# Patient Record
Sex: Female | Born: 1970 | Race: White | Hispanic: No | Marital: Single | Smoking: Never smoker
Health system: Southern US, Community
[De-identification: ages and names within clinical notes are randomized; demographics above are authoritative.]

## PROBLEM LIST (undated history)

## (undated) DIAGNOSIS — T7840XA Allergy, unspecified, initial encounter: Secondary | ICD-10-CM

## (undated) DIAGNOSIS — J45909 Unspecified asthma, uncomplicated: Secondary | ICD-10-CM

## (undated) HISTORY — DX: Allergy, unspecified, initial encounter: T78.40XA

## (undated) HISTORY — PX: NO PAST SURGERIES: SHX2092

## (undated) HISTORY — DX: Unspecified asthma, uncomplicated: J45.909

---

## 1999-09-24 ENCOUNTER — Other Ambulatory Visit: Admission: RE | Admit: 1999-09-24 | Discharge: 1999-09-24 | Payer: Self-pay | Admitting: *Deleted

## 2006-02-05 ENCOUNTER — Ambulatory Visit (HOSPITAL_COMMUNITY): Admission: RE | Admit: 2006-02-05 | Discharge: 2006-02-05 | Payer: Self-pay | Admitting: Family Medicine

## 2006-03-31 ENCOUNTER — Ambulatory Visit (HOSPITAL_COMMUNITY): Admission: RE | Admit: 2006-03-31 | Discharge: 2006-03-31 | Payer: Self-pay | Admitting: Obstetrics & Gynecology

## 2006-08-03 ENCOUNTER — Inpatient Hospital Stay (HOSPITAL_COMMUNITY): Admission: AD | Admit: 2006-08-03 | Discharge: 2006-08-05 | Payer: Self-pay | Admitting: Obstetrics and Gynecology

## 2007-10-19 IMAGING — US US OB COMP LESS 14 WK
1 series · 14 of 28 positions shown · non-contrast
Comparison: none

CLINICAL DATA: Size/date discrepancy.  11 week 4 day gestational age by LMP.  Evaluate dating.
OBSTETRICAL ULTRASOUND <14 WKS:

[Series 1: us ob comp less 14 wk · 0.31mm/px · 14 of 46 slices shown]
[im 2/46]
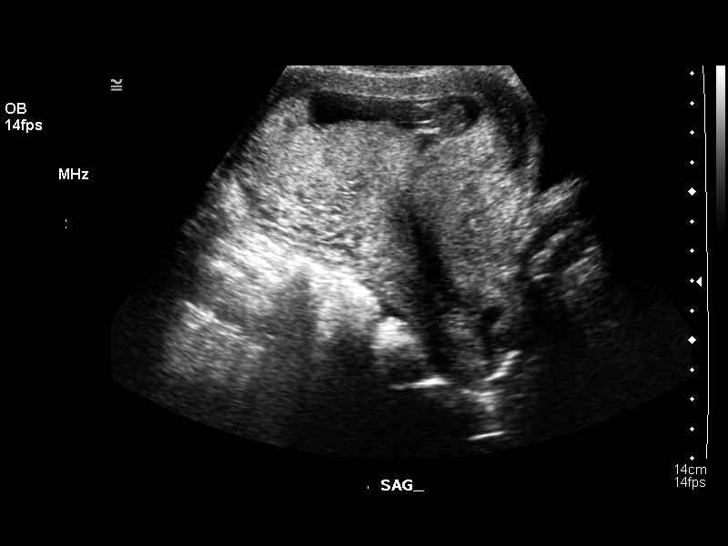
[im 6/46]
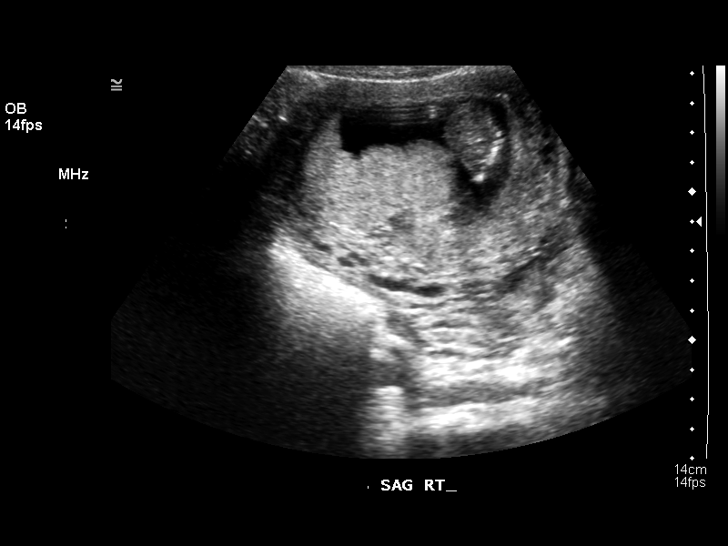
[im 9/46]
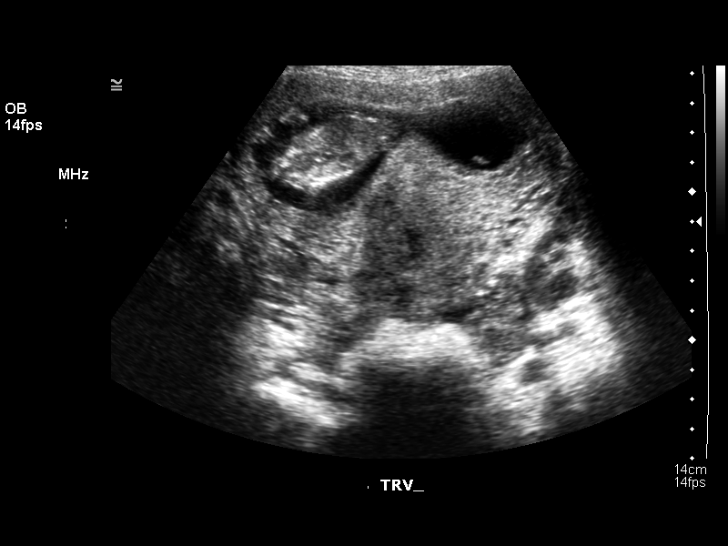
[im 12/46]
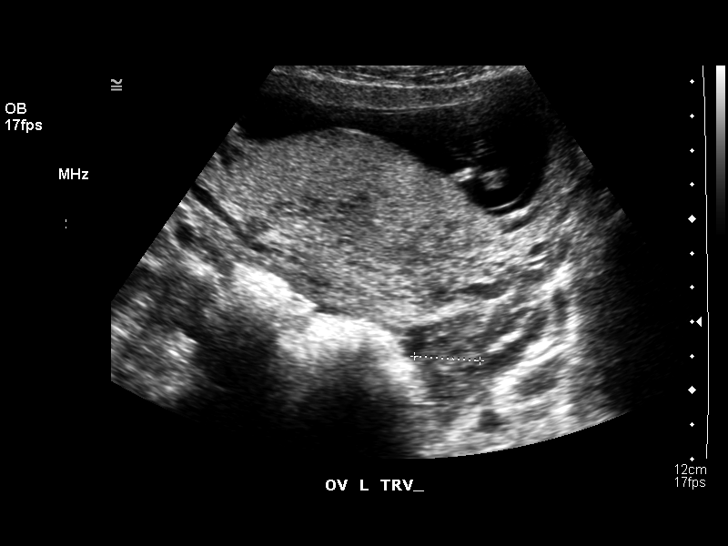
[im 16/46]
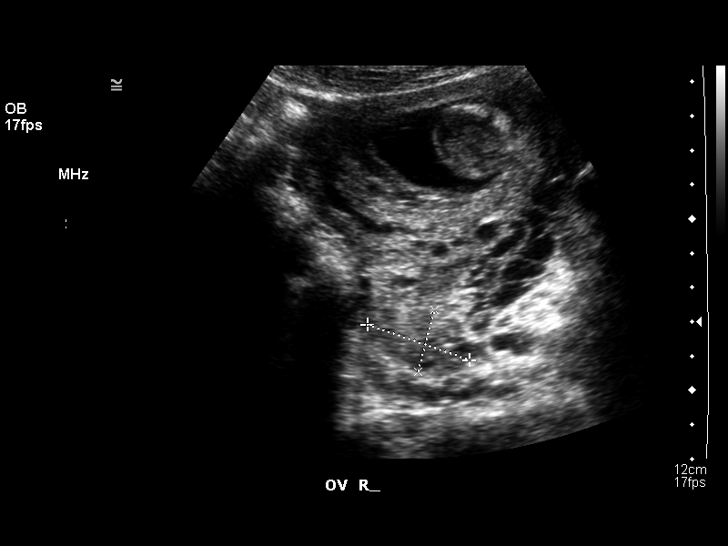
[im 19/46]
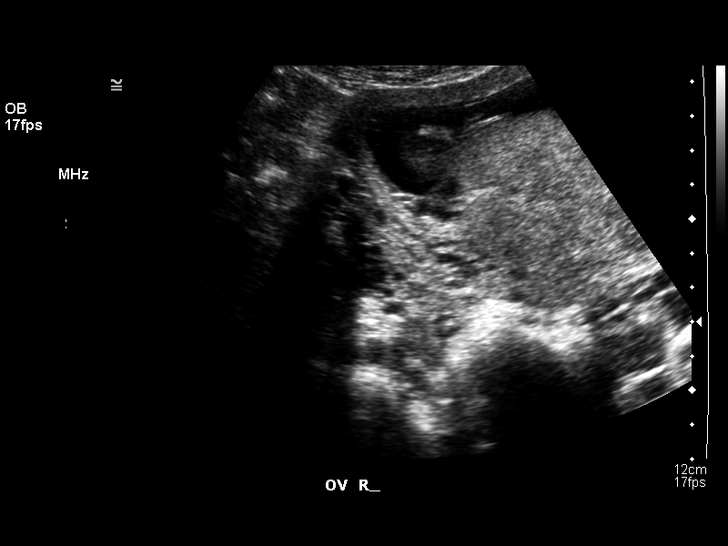
[im 22/46]
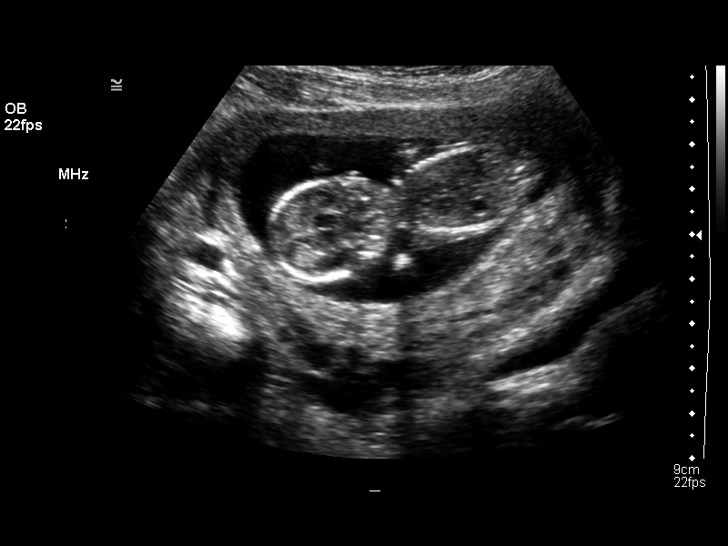
[im 26/46]
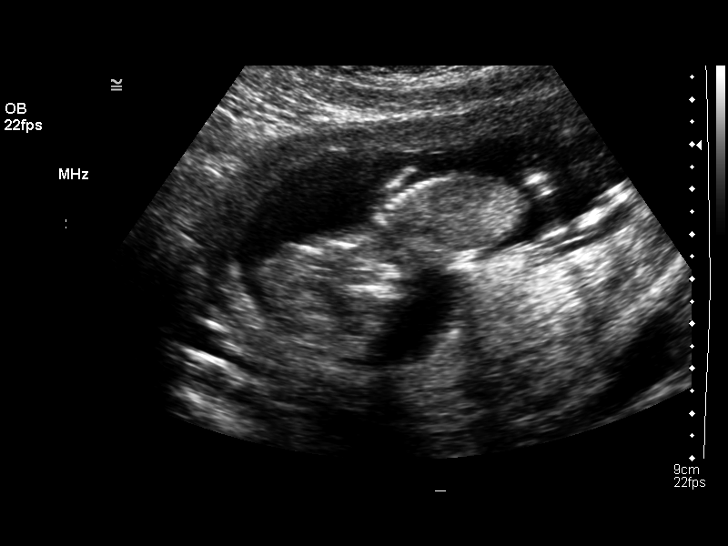
[im 29/46]
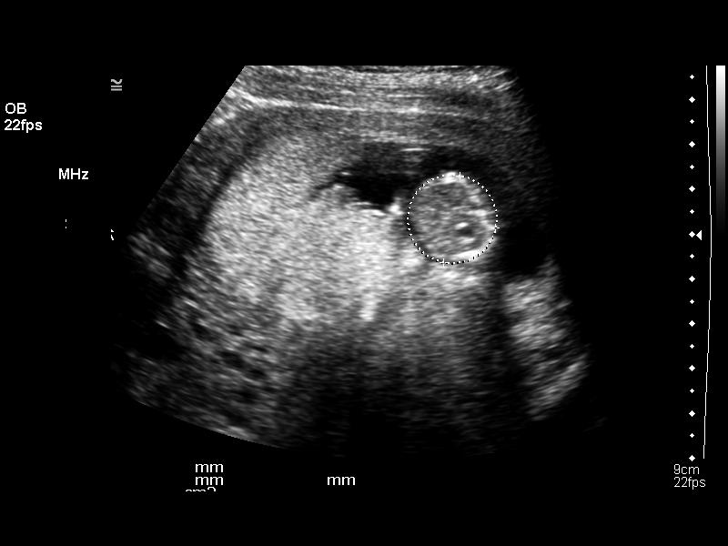
[im 32/46]
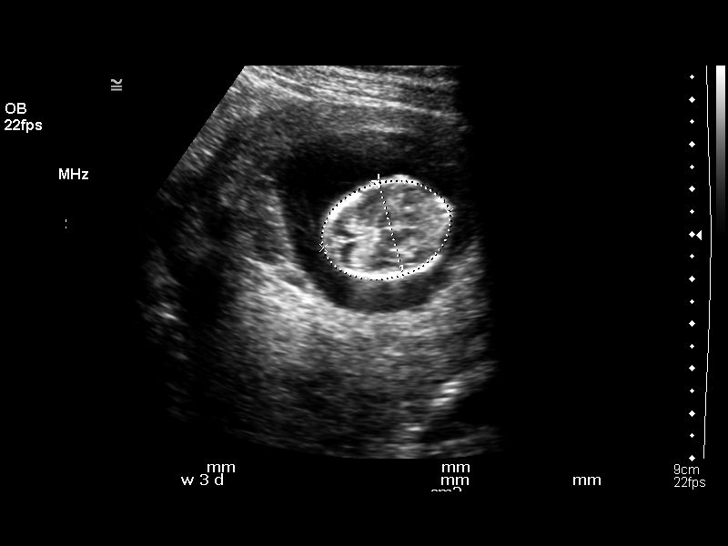
[im 36/46]
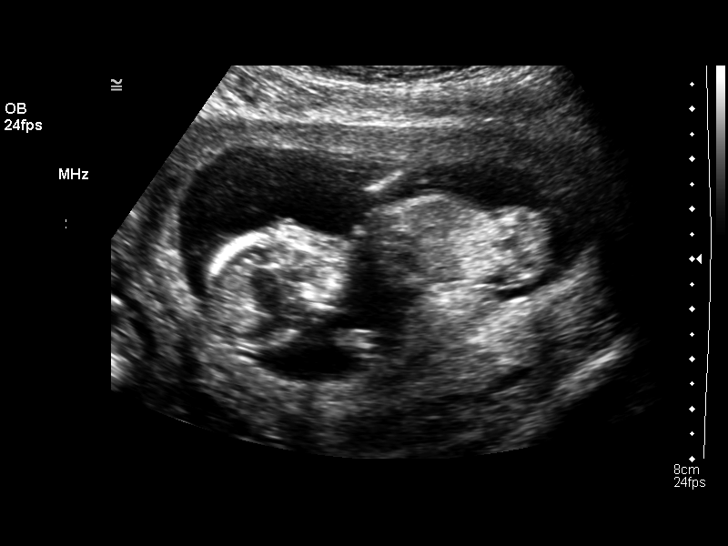
[im 39/46]
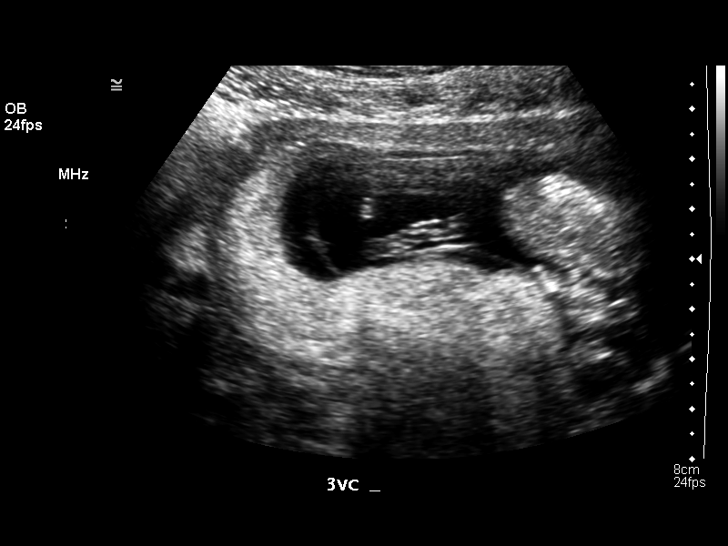
[im 42/46]
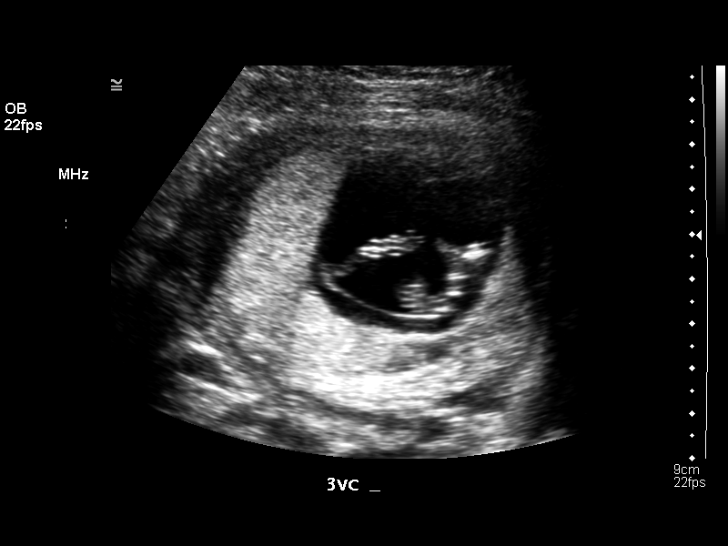
[im 46/46]
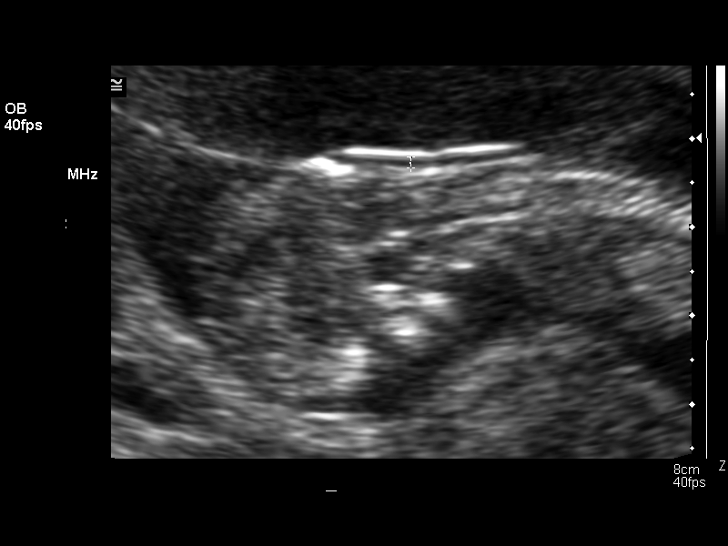

[14 of 28 positions shown; findings below may reference images not displayed]

Number of Fetuses:  1
Heart Rate:  153 bpm
Movement:  Yes  
Presentation:  Variable      
Amniotic Fluid (Subjective):  Normal

FETAL BIOMETRY
CRL:  7.0 cm    13 w  1 d
BPD:  2.1 cm    13 w  3 d 
HC:  7.8 cm    13 w  3 d
AC:  6.1 cm    12 w  6 d
FL:  0.9 cm    12 w  5 d

MEAN GA:  13 w  1 d   US EDC:  08/12/06

Fetal anatomy could not be evaluated due to the early gestational age.  
The following fetal anatomy was visualized:  Choroid plexus, stomach, 3-vessel cord, cord insertion site, kidneys, bladder, and diaphragm.

MATERNAL FINDINGS
Cervix:  Closed.  
Both ovaries are unremarkable.
IMPRESSION: 1. Single living intrauterine fetus with mean gestational age of 13 weeks 1 day and sonographic EDC of 08/12/06.  This is approximately 1.5 weeks ahead of LMP, casting doubt on accuracy of menstrual dates.  Recommend using today?s ultrasound dates for assigned gestational age.  
2.   No early fetal anatomic abnormality noted.  Follow-up ultrasound for complete anatomic evaluation is recommended at 18 to 20 weeks gestational age.

## 2008-10-19 ENCOUNTER — Emergency Department (HOSPITAL_COMMUNITY): Admission: EM | Admit: 2008-10-19 | Discharge: 2008-10-19 | Payer: Self-pay | Admitting: Internal Medicine

## 2010-04-09 ENCOUNTER — Encounter: Admission: RE | Admit: 2010-04-09 | Discharge: 2010-04-09 | Payer: Self-pay | Admitting: Obstetrics and Gynecology

## 2010-09-25 LAB — POCT PREGNANCY, URINE: Preg Test, Ur: NEGATIVE
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
## 2012-01-29 ENCOUNTER — Other Ambulatory Visit: Payer: Self-pay | Admitting: Obstetrics and Gynecology

## 2012-01-29 DIAGNOSIS — R928 Other abnormal and inconclusive findings on diagnostic imaging of breast: Secondary | ICD-10-CM

## 2012-02-12 ENCOUNTER — Ambulatory Visit
Admission: RE | Admit: 2012-02-12 | Discharge: 2012-02-12 | Disposition: A | Discharge status: Home / Self Care | Payer: 59 | Source: Ambulatory Visit | Attending: Obstetrics and Gynecology | Admitting: Obstetrics and Gynecology

## 2012-02-12 DIAGNOSIS — R928 Other abnormal and inconclusive findings on diagnostic imaging of breast: Secondary | ICD-10-CM

## 2015-09-28 DIAGNOSIS — M7501 Adhesive capsulitis of right shoulder: Secondary | ICD-10-CM | POA: Diagnosis not present

## 2015-11-03 DIAGNOSIS — J0140 Acute pansinusitis, unspecified: Secondary | ICD-10-CM | POA: Diagnosis not present

## 2015-11-27 ENCOUNTER — Ambulatory Visit (INDEPENDENT_AMBULATORY_CARE_PROVIDER_SITE_OTHER): Payer: Self-pay | Admitting: Family Medicine

## 2015-11-27 ENCOUNTER — Encounter: Payer: Self-pay | Admitting: Family Medicine

## 2015-11-27 VITALS — BP 90/58 | HR 67 | Temp 98.4°F | Ht 64.25 in | Wt 129.3 lb

## 2015-11-27 DIAGNOSIS — Z7189 Other specified counseling: Secondary | ICD-10-CM

## 2015-11-27 DIAGNOSIS — J45909 Unspecified asthma, uncomplicated: Secondary | ICD-10-CM | POA: Insufficient documentation

## 2015-11-27 DIAGNOSIS — Z7689 Persons encountering health services in other specified circumstances: Secondary | ICD-10-CM

## 2015-11-27 DIAGNOSIS — J302 Other seasonal allergic rhinitis: Secondary | ICD-10-CM

## 2015-11-27 DIAGNOSIS — R5383 Other fatigue: Secondary | ICD-10-CM

## 2015-11-27 MED ORDER — FLUTICASONE PROPIONATE HFA 44 MCG/ACT IN AERO: 2.0000 | INHALATION_SPRAY | Freq: Two times a day (BID) | RESPIRATORY_TRACT | Status: DC

## 2015-11-27 MED ORDER — ALBUTEROL SULFATE HFA 108 (90 BASE) MCG/ACT IN AERS: 2.0000 | INHALATION_SPRAY | Freq: Four times a day (QID) | RESPIRATORY_TRACT | Status: DC | PRN

## 2015-11-27 NOTE — Progress Notes (Signed)
Pre visit review using our clinic review tool, if applicable. No additional management support is needed unless otherwise documented below in the visit note. 

## 2015-11-27 NOTE — Patient Instructions (Addendum)
BEFORE YOU LEAVE: -schedule physical with pap in 3 months; morning appointment if possible, come fasting and we will plan to do labs  Call the number provided to schedule mammogram.  Consider allergy testing.  Allegra and flonase daily. Consider adding the inhaler (fluticasone/flovent) if cough is not improving.  The proair/albuterol if only for use as needed if other measures are not working.  We recommend the following healthy lifestyle measures: - eat a healthy whole foods diet consisting of regular small meals composed of vegetables, fruits, beans, nuts, seeds, healthy meats such as white chicken and fish and whole grains.  - avoid sweets, white starchy foods, fried foods, fast food, processed foods, sodas, red meet and other fattening foods.  - get a least 150-300 minutes of aerobic exercise per week.

## 2015-11-27 NOTE — Progress Notes (Signed)
HPI:  Cathy Weaver is here to establish care. Use to see Dr. Yehuda Budd, but that provider  Last PCP and physical: saw Debbora Dus in gyn for [facility] Plans to do her physicals here in the future. Will do her mammogram at the breast center.   Has the following chronic problems that require follow up and concerns today:  Hx asthma and allergies: -[facility] -never had allergy testing, worse in the spring gets sneezing, itchy eyes, nasal congestion -had testing as an adult, dx with mild asthma in the past and was given and as needed inhaler -only has symptoms very rarely if sick -she has had a cough constantly since she was a child -she has a 49 yo daughter, works several jobs, cares for horses, single mom, does not take care of herself - feels tired from all this chronically, but wonders if asthma contributes -Denies depression or anxiety  ROS negative for unless reported above: fevers, unintentional weight loss, hearing or vision loss, chest pain, palpitations, struggling to breath, hemoptysis, melena, hematochezia, hematuria, falls, loc, si, thoughts of self harm  Past Medical History  Diagnosis Date  . Allergy   . Asthma     No past surgical history on file.  Family History  Problem Relation Age of Onset  . Alcoholism Father   . Rheum arthritis Mother   . Breast cancer Mother   . Bipolar disorder Maternal Uncle     Social History   Social History  . Marital Status: Single    Spouse Name: N/A  . Number of Children: N/A  . Years of Education: N/A   Social History Main Topics  . Smoking status: Never Smoker   . Smokeless tobacco: None  . Alcohol Use: None  . Drug Use: None  . Sexual Activity: Not Asked   Other Topics Concern  . None   Social History Narrative   Work or School: [employer], farm sitting, teaches horse back riding, works for Emergency planning/management officer Situation:  lives with daughter      Spiritual Beliefs:      Lifestyle: no regular aerobic activity, eats healthy           Current outpatient prescriptions:  .  albuterol (PROAIR HFA) 108 (90 Base) MCG/ACT inhaler, Inhale 2 puffs into the lungs every 6 (six) hours as needed for wheezing or shortness of breath., Disp: 1 Inhaler, Rfl: 3 .  fluticasone (FLOVENT HFA) 44 MCG/ACT inhaler, Inhale 2 puffs into the lungs 2 (two) times daily., Disp: 1 Inhaler, Rfl: 12  EXAM:  Filed Vitals:   11/27/15 1313  BP: 90/58  Pulse: 67  Temp: 98.4 F (36.9 C)    Body mass index is 22.02 kg/(m^2).  GENERAL: vitals reviewed and listed above, alert, oriented, appears well hydrated and in no acute distress  HEENT: atraumatic, conjunttiva clear, no obvious abnormalities on inspection of external nose and ears  NECK: no obvious masses on inspection  LUNGS: clear to auscultation bilaterally, no wheezes, rales or rhonchi, good air movement  CV: HRRR, no peripheral edema  MS: moves all extremities without noticeable abnormality  PSYCH: pleasant and cooperative, no obvious depression or anxiety  ASSESSMENT AND PLAN:  Discussed the following assessment and plan: 30 minutes spent with patient, >50% face to face. Asthma, unspecified asthma severity, uncomplicated - Plan: fluticasone (FLOVENT HFA) 44 MCG/ACT inhaler, albuterol (  PROAIR HFA) 108 (90 Base) MCG/ACT inhaler Seasonal allergies -flonase and allegra daily -she is reluctant to do inhalers for her asthma, likely cough variant or allergies could be triggering cough, no acid symptoms but will keep this in mind as well -she plans to do allergy testing - number provided to call -trial flovent if cough not improving with above -follow up for CPE, she wants to do labs then  Other fatigue -likely crazy lifestyle and stress contributing, she denies anxiety or depression and is changing work schedule to decrease stress -advised healthy lifestyle and labs -  she opted to do labs at physical  Encounter to establish care -We reviewed the PMH, PSH, FH, SH, Meds and Allergies. -We provided refills for any medications we will prescribe as needed. -We addressed current concerns per orders and patient instructions. -We have asked for records for pertinent exams, studies, vaccines and notes from previous providers. -We have advised patient to follow up per instructions below.  She plans to follow up in 3 months and do labs then.   -Patient advised to return or notify a doctor immediately if symptoms worsen or persist or new concerns arise.  Patient Instructions  BEFORE YOU LEAVE: -schedule physical with pap in 3 months; morning appointment if possible, come fasting and we will plan to do labs  Call the number provided to schedule mammogram.  Consider allergy testing.  Allegra and flonase daily. Consider adding the inhaler (fluticasone/flovent) if cough is not improving.  The proair/albuterol if only for use as needed if other measures are not working.  We recommend the following healthy lifestyle measures: - eat a healthy whole foods diet consisting of regular small meals composed of vegetables, fruits, beans, nuts, seeds, healthy meats such as white chicken and fish and whole grains.  - avoid sweets, white starchy foods, fried foods, fast food, processed foods, sodas, red meet and other fattening foods.  - get a least 150-300 minutes of aerobic exercise per week.       Kriste BasqueKIM, HANNAH R.

## 2016-02-02 DIAGNOSIS — J0100 Acute maxillary sinusitis, unspecified: Secondary | ICD-10-CM | POA: Diagnosis not present

## 2016-02-19 NOTE — Progress Notes (Signed)
HPI:  Here for CPE:  -Concerns and/or follow up today: none  Allergies/Mild intermittent asthma: -now seeing asthma allergy specialist - made appt today -stable  -Diet: variety of foods, balance and well rounded  -Exercise: regular exercise - care for and rides horses  -Taking folic acid, vitamin D or calcium: no  -Diabetes and Dyslipidemia Screening: FASTING, labs done  -Hx of HTN: no  -Vaccines: declined all  -pap history: [facility] - has mirena - placed 2 years ago with gyn; reporte remote abn pap 25 years ago treated with cryo all normal since  -FDLMP: 01/24/16  -sexual activity: yes, female partner, no new partners  -wants STI testing (Hep C if born [birth year]): no  -FH breast, colon or ovarian ca: see FH Last mammogram: not utd, agrees to schedule and declines assistance Last colon cancer screening: n/a  Breast Ca Risk Assessment: -discussed FH, opted for yearly mammo - she agrees to schedule   -Alcohol, Tobacco, drug use: see social history  Review of Systems - no fevers, unintentional weight loss, vision loss, hearing loss, chest pain, sob, hemoptysis, melena, hematochezia, hematuria, genital discharge, changing or concerning skin lesions, bleeding, bruising, loc, thoughts of self harm or SI  Past Medical History:  Diagnosis Date  . Allergy   . Asthma     No past surgical history on file.  Family History  Problem Relation Age of Onset  . Alcoholism Father   . Rheum arthritis Mother   . Breast cancer Mother   . Bipolar disorder Maternal Uncle     Social History   Social History  . Marital status: Single    Spouse name: N/A  . Number of children: N/A  . Years of education: N/A   Social History Main Topics  . Smoking status: Never Smoker  . Smokeless tobacco: None  . Alcohol use None  . Drug use: Unknown  . Sexual activity: Not Asked   Other Topics Concern  . None   Social History Narrative   Work or  School: [employer], farm sitting, teaches horse back riding, works for Emergency planning/management officer Situation: lives with daughter      Spiritual Beliefs:      Lifestyle: no regular aerobic activity, eats healthy           Current Outpatient Prescriptions:  .  albuterol (PROAIR HFA) 108 (90 Base) MCG/ACT inhaler, Inhale 2 puffs into the lungs every 6 (six) hours as needed for wheezing or shortness of breath., Disp: 1 Inhaler, Rfl: 3 .  fluticasone (FLOVENT HFA) 44 MCG/ACT inhaler, Inhale 2 puffs into the lungs 2 (two) times daily., Disp: 1 Inhaler, Rfl: 12  EXAM:  Vitals:   02/20/16 0942  BP: 90/70  Pulse: (!) 59  Temp: 98.2 F (36.8 C)    GENERAL: vitals reviewed and listed below, alert, oriented, appears well hydrated and in no acute distress  HEENT: head atraumatic, PERRLA, normal appearance of eyes, ears, nose and mouth. moist mucus membranes.  NECK: supple, no masses or lymphadenopathy  LUNGS: clear to auscultation bilaterally, no rales, rhonchi or wheeze  CV: HRRR, no peripheral edema or cyanosis, normal pedal pulses  BREAST: normal appearance - no lesions or discharge, on palpation normal breast tissue without any suspicious masses  ABDOMEN: bowel sounds normal, soft, non tender to palpation, no masses, no rebound or guarding  GU: normal appearance of external genitalia - no lesions or masses,  normal vaginal mucosa - no abnormal discharge, normal appearance of cervix - no lesions or abnormal discharge, no masses or tenderness on palpation of uterus and ovaries.  RECTAL: refused  SKIN: no rash or abnormal lesions  MS: normal gait, moves all extremities normally  NEURO: normal gait, speech and thought processing grossly intact, muscle tone grossly intact throughout  PSYCH: normal affect, pleasant and cooperative  ASSESSMENT AND PLAN:  Discussed the following assessment and plan:  Encounter for preventive health examination - Plan: Lipid Panel,  Hemoglobin A1c   -Discussed and advised all [place] preventive services health task force level A and B recommendations for age, sex and risks.  -Advised at least 150 minutes of exercise per week and a healthy diet with avoidance of (less then 1 serving per week) processed foods, white starches, red meat, fast foods and sweets and consisting of: * 5-9 servings of fresh fruits and vegetables (not corn or potatoes) *nuts and seeds, beans *olives and olive oil *lean meats such as fish and white chicken  *whole grains  -labs, studies and vaccines per orders this encounter  Orders Placed This Encounter  Procedures  . Lipid Panel  . Hemoglobin A1c    Patient advised to return to clinic immediately if symptoms worsen or persist or new concerns.  Patient Instructions  BEFORE YOU LEAVE: -follow up: yearly for physical  Please call today to schedule mammogram.  Vit D3 800 IU daily  We have ordered labs or studies at this visit. It can take up to 1-2 weeks for results and processing. IF results require follow up or explanation, we will call you with instructions. Clinically stable results will be released to your [facility]. If you have not heard from us or cannot find your results in [facility] in 2 weeks please contact our office at [phone].  If you are not yet signed up for [facility], please consider signing up.  We recommend the following healthy lifestyle for LIFE: 1) Small portions.   Tip: eat off of a salad plate instead of a dinner plate.  Tip: It is ok to feel hungry after a meal - that likely means you ate an appropriate  Portion.  Tip: if you need more or a snack choose fruits, veggies and/or a handful of nuts  or seeds.  2) Eat a healthy clean diet.  * Tip: Avoid (less then 1 serving per week): processed foods, sweets, sweetened  drinks, white starches (rice, flour, bread, potatoes, pasta, etc), red meat, fast  foods, butter  *Tip: CHOOSE instead   * 5-9 servings per day of fresh  or frozen fruits and vegetables (but   not corn, potatoes, bananas, canned or dried fruit)   *nuts and seeds, beans   *olives and olive oil   *small portions of lean meats such as fish and white chicken    *small portions of whole grains  3)Get at least 150 minutes of sweaty aerobic exercise per week.  4)Reduce stress - consider counseling, meditation and relaxation to balance other aspects of your life.     No Follow-up on file.  Kriste BasqueKIM, Annamaria Salah R., DO

## 2016-02-20 ENCOUNTER — Ambulatory Visit (INDEPENDENT_AMBULATORY_CARE_PROVIDER_SITE_OTHER): Payer: BLUE CROSS/BLUE SHIELD | Admitting: Family Medicine

## 2016-02-20 ENCOUNTER — Other Ambulatory Visit (HOSPITAL_COMMUNITY)
Admission: RE | Admit: 2016-02-20 | Discharge: 2016-02-20 | Disposition: A | Discharge status: Home / Self Care | Payer: BLUE CROSS/BLUE SHIELD | Source: Ambulatory Visit | Attending: Family Medicine | Admitting: Family Medicine

## 2016-02-20 ENCOUNTER — Encounter: Payer: Self-pay | Admitting: Family Medicine

## 2016-02-20 VITALS — BP 90/70 | HR 59 | Temp 98.2°F | Ht 65.0 in | Wt 131.9 lb

## 2016-02-20 DIAGNOSIS — Z0000 Encounter for general adult medical examination without abnormal findings: Secondary | ICD-10-CM

## 2016-02-20 DIAGNOSIS — Z124 Encounter for screening for malignant neoplasm of cervix: Secondary | ICD-10-CM | POA: Diagnosis not present

## 2016-02-20 DIAGNOSIS — Z1151 Encounter for screening for human papillomavirus (HPV): Secondary | ICD-10-CM | POA: Diagnosis not present

## 2016-02-20 DIAGNOSIS — Z01419 Encounter for gynecological examination (general) (routine) without abnormal findings: Secondary | ICD-10-CM | POA: Diagnosis not present

## 2016-02-20 LAB — LIPID PANEL
CHOLESTEROL: 159 mg/dL (ref 0–200)
HDL: 56.8 mg/dL (ref >39.00)
LDL Cholesterol: 94 mg/dL (ref 0–99)
NONHDL: 102.32
Total CHOL/HDL Ratio: 3
Triglycerides: 40 mg/dL (ref 0.0–149.0)
VLDL: 8 mg/dL (ref 0.0–40.0)

## 2016-02-20 LAB — HEMOGLOBIN A1C: HEMOGLOBIN A1C: 5.6 % (ref 4.6–6.5)

## 2016-02-20 NOTE — Progress Notes (Signed)
Pre visit review using our clinic review tool, if applicable. No additional management support is needed unless otherwise documented below in the visit note. 

## 2016-02-20 NOTE — Addendum Note (Signed)
Addended by: Johnella MoloneyFUNDERBURK, Slayden Mennenga A on: 02/20/2016 10:17 AM   Modules accepted: Orders

## 2016-02-20 NOTE — Patient Instructions (Addendum)
BEFORE YOU LEAVE: -follow up: yearly for physical  Please call today to schedule mammogram.  Vit D3 800 IU daily  We have ordered labs or studies at this visit. It can take up to 1-2 weeks for results and processing. IF results require follow up or explanation, we will call you with instructions. Clinically stable results will be released to your [facility]. If you have not heard from us or cannot find your results in [facility] in 2 weeks please contact our office at [phone].  If you are not yet signed up for [facility]ART, please consider signing up.  We recommend the following healthy lifestyle for LIFE: 1) Small portions.   Tip: eat off of a salad plate instead of a dinner plate.  Tip: It is ok to feel hungry after a meal - that likely means you ate an appropriate  Portion.  Tip: if you need more or a snack choose fruits, veggies and/or a handful of nuts  or seeds.  2) Eat a healthy clean diet.  * Tip: Avoid (less then 1 serving per week): processed foods, sweets, sweetened  drinks, white starches (rice, flour, bread, potatoes, pasta, etc), red meat, fast  foods, butter  *Tip: CHOOSE instead   * 5-9 servings per day of fresh or frozen fruits and vegetables (but   not corn, potatoes, bananas, canned or dried fruit)   *nuts and seeds, beans   *olives and olive oil   *small portions of lean meats such as fish and white chicken    *small portions of whole grains  3)Get at least 150 minutes of sweaty aerobic exercise per week.  4)Reduce stress - consider counseling, meditation and relaxation to balance other aspects of your life.

## 2016-02-21 LAB — CYTOLOGY - PAP

## 2016-03-21 DIAGNOSIS — R05 Cough: Secondary | ICD-10-CM | POA: Diagnosis not present

## 2016-03-21 DIAGNOSIS — J3089 Other allergic rhinitis: Secondary | ICD-10-CM | POA: Diagnosis not present

## 2016-03-21 DIAGNOSIS — J3081 Allergic rhinitis due to animal (cat) (dog) hair and dander: Secondary | ICD-10-CM | POA: Diagnosis not present

## 2016-03-21 DIAGNOSIS — J301 Allergic rhinitis due to pollen: Secondary | ICD-10-CM | POA: Diagnosis not present

## 2016-06-04 ENCOUNTER — Other Ambulatory Visit: Payer: Self-pay | Admitting: Family Medicine

## 2016-06-04 DIAGNOSIS — Z1231 Encounter for screening mammogram for malignant neoplasm of breast: Secondary | ICD-10-CM

## 2016-06-26 ENCOUNTER — Ambulatory Visit
Admission: RE | Admit: 2016-06-26 | Discharge: 2016-06-26 | Disposition: A | Discharge status: Home / Self Care | Payer: BLUE CROSS/BLUE SHIELD | Source: Ambulatory Visit | Attending: Family Medicine | Admitting: Family Medicine

## 2016-06-26 DIAGNOSIS — Z1231 Encounter for screening mammogram for malignant neoplasm of breast: Secondary | ICD-10-CM

## 2016-10-25 DIAGNOSIS — J0140 Acute pansinusitis, unspecified: Secondary | ICD-10-CM | POA: Diagnosis not present

## 2016-10-30 DIAGNOSIS — J3081 Allergic rhinitis due to animal (cat) (dog) hair and dander: Secondary | ICD-10-CM | POA: Diagnosis not present

## 2016-10-30 DIAGNOSIS — R05 Cough: Secondary | ICD-10-CM | POA: Diagnosis not present

## 2016-10-30 DIAGNOSIS — J3089 Other allergic rhinitis: Secondary | ICD-10-CM | POA: Diagnosis not present

## 2016-10-30 DIAGNOSIS — J301 Allergic rhinitis due to pollen: Secondary | ICD-10-CM | POA: Diagnosis not present

## 2017-02-14 NOTE — Progress Notes (Signed)
HPI:  Here for CPE:  -Concerns and/or follow up today: none Seeing Dr. Remus Blake for her allergies. She did test positive for a lot of trees and grasses. Does well if she stays on her allergy medications.  -Diet: variety of foods, balance and well rounded  -Exercise: regular exercise  -Taking folic acid, vitamin D or calcium: no  -Hx of HTN: no  -Vaccines: UTD, refused flu vaccine  -pap history: last done here 02/2016 and normal; HPV negative. Reported history of abnormal Pap greater than 20 years ago, treated with cryotherapy and all normal since per report.  -FDLMP: occ skips periods, normal otherwise  -wants STI testing (Hep C if born [birth year]): no  -FH breast, colon or ovarian ca: see FH Last mammogram:06/2016 - BI-RADS 1 Last colon cancer screening:not applicable    -Alcohol, Tobacco, drug use: see social history  Review of Systems - no fevers, unintentional weight loss, vision loss, hearing loss, chest pain, sob, hemoptysis, melena, hematochezia, hematuria, genital discharge, changing or concerning skin lesions, bleeding, bruising, loc, thoughts of self harm or SI  Past Medical History:  Diagnosis Date  . Allergy   . Asthma     No past surgical history on file.  Family History  Problem Relation Age of Onset  . Alcoholism Father   . Rheum arthritis Mother   . Breast cancer Mother   . Bipolar disorder Maternal Uncle     Social History   Social History  . Marital status: Single    Spouse name: N/A  . Number of children: N/A  . Years of education: N/A   Social History Main Topics  . Smoking status: Never Smoker  . Smokeless tobacco: Never Used  . Alcohol use None  . Drug use: Unknown  . Sexual activity: Not Asked   Other Topics Concern  . None   Social History Narrative   Work or School: [employer], farm sitting, teaches horse back riding, works for [employer], [employer] (consulting) Situation: lives with daughter      Spiritual Beliefs:       Lifestyle: no regular aerobic activity, eats healthy           Current Outpatient Prescriptions:  .  fluticasone (FLOVENT HFA) 44 MCG/ACT inhaler, Inhale 2 puffs into the lungs 2 (two) times daily., Disp: 1 Inhaler, Rfl: 12 .  levocetirizine (XYZAL) 5 MG tablet, TK 1 T PO QD IN THE EVE, Disp: , Rfl: 5 .  montelukast (SINGULAIR) 10 MG tablet, TK 1 T PO QD IN THE EVE, Disp: , Rfl: 6  EXAM:  Vitals:   02/20/17 0812  BP: (!) 82/58  Pulse: 64  Temp: 98.2 F (36.8 C)    GENERAL: vitals reviewed and listed below, alert, oriented, appears well hydrated and in no acute distress  HEENT: head atraumatic, PERRLA, normal appearance of eyes, ears, nose and mouth. moist mucus membranes.  NECK: supple, no masses or lymphadenopathy  LUNGS: clear to auscultation bilaterally, no rales, rhonchi or wheeze  CV: HRRR, no peripheral edema or cyanosis, normal pedal pulses  ABDOMEN: bowel sounds normal, soft, non tender to palpation, no masses, no rebound or guarding  SKIN: no rash or abnormal lesions  BREAST: normal appearance - no skin lesions or discharge noted on inspection of both breasts, on palpation of both breast and axillary region no suspicious lesions appreciated today  GU: deferred  RECTAL: deferred  MS: normal gait, moves all extremities normally  NEURO: normal gait,  speech and thought processing grossly intact, muscle tone grossly intact throughout  PSYCH: normal affect, pleasant and cooperative  ASSESSMENT AND PLAN:  Discussed the following assessment and plan:  Encounter for preventative adult health care examination  Screening for depression  Seasonal allergic rhinitis due to pollen  -Discussed and advised all [place] preventive services health task force level A and B recommendations for age, sex and risks.  -Advised at least 150 minutes of exercise per week and a healthy diet with avoidance of (less then 1 serving per week) processed foods, white starches, red meat,  fast foods and sweets and consisting of: * 5-9 servings of fresh fruits and vegetables (not corn or potatoes) *nuts and seeds, beans *olives and olive oil *lean meats such as fish and white chicken  *whole grains  -Declined flu vaccine  No orders of the defined types were placed in this encounter.   Patient advised to return to clinic immediately if symptoms worsen or persist or new concerns.  Patient Instructions  BEFORE YOU LEAVE: -follow HO:ZYYQMG for physical and as needed  Vitamin D3 [phone] international units daily, particularly through the fall, winter and spring.  Get your mammogram in January.  Health Maintenance, Female Adopting a healthy lifestyle and getting preventive care can go a long way to promote health and wellness. Talk with your health care provider about what schedule of regular examinations is right for you. This is a good chance for you to check in with your provider about disease prevention and staying healthy. In between checkups, there are plenty of things you can do on your own. Experts have done a lot of research about which lifestyle changes and preventive measures are most likely to keep you healthy. Ask your health care provider for more information. Weight and diet Eat a healthy diet  Be sure to include plenty of vegetables, fruits, low-fat dairy products, and lean protein.  Do not eat a lot of foods high in solid fats, added sugars, or salt.  Get regular exercise. This is one of the most important things you can do for your health. ? Most adults should exercise for at least 150 minutes each week. The exercise should increase your heart rate and make you sweat (moderate-intensity exercise). ? Most adults should also do strengthening exercises at least twice a week. This is in addition to the moderate-intensity exercise.  Maintain a healthy weight  Body mass index (BMI) is a measurement that can be used to identify possible weight problems. It  estimates body fat based on height and weight. Your health care provider can help determine your BMI and help you achieve or maintain a healthy weight.  For females 7 years of age and older: ? A BMI below 18.5 is considered underweight. ? A BMI of 18.5 to 24.9 is normal. ? A BMI of 25 to 29.9 is considered overweight. ? A BMI of 30 and above is considered obese.  Watch levels of cholesterol and blood lipids  You should start having your blood tested for lipids and cholesterol at 47 years of age, then have this test every 5 years.  You may need to have your cholesterol levels checked more often if: ? Your lipid or cholesterol levels are high. ? You are older than 47 years of age. ? You are at high risk for heart disease.  Cancer screening Lung Cancer  Lung cancer screening is recommended for adults 75-2 years old who are at high risk for lung cancer because of a  history of smoking.  A yearly low-dose CT scan of the lungs is recommended for people who: ? Currently smoke. ? Have quit within the past 15 years. ? Have at least a 30-pack-year history of smoking. A pack year is smoking an average of one pack of cigarettes a day for 1 year.  Yearly screening should continue until it has been 15 years since you quit.  Yearly screening should stop if you develop a health problem that would prevent you from having lung cancer treatment.  Breast Cancer  Practice breast self-awareness. This means understanding how your breasts normally appear and feel.  It also means doing regular breast self-exams. Let your health care provider know about any changes, no matter how small.  If you are in your 20s or 30s, you should have a clinical breast exam (CBE) by a health care provider every 1-3 years as part of a regular health exam.  If you are 73 or older, have a CBE every year. Also consider having a breast X-ray (mammogram) every year.  If you have a family history of breast cancer, talk to  your health care provider about genetic screening.  If you are at high risk for breast cancer, talk to your health care provider about having an MRI and a mammogram every year.  Breast cancer gene (BRCA) assessment is recommended for women who have family members with BRCA-related cancers. BRCA-related cancers include: ? Breast. ? Ovarian. ? Tubal. ? Peritoneal cancers.  Results of the assessment will determine the need for genetic counseling and BRCA1 and BRCA2 testing.  Cervical Cancer Your health care provider may recommend that you be screened regularly for cancer of the pelvic organs (ovaries, uterus, and vagina). This screening involves a pelvic examination, including checking for microscopic changes to the surface of your cervix (Pap test). You may be encouraged to have this screening done every 3 years, beginning at age 53.  For women ages 83-65, health care providers may recommend pelvic exams and Pap testing every 3 years, or they may recommend the Pap and pelvic exam, combined with testing for human papilloma virus (HPV), every 5 years. Some types of HPV increase your risk of cervical cancer. Testing for HPV may also be done on women of any age with unclear Pap test results.  Other health care providers may not recommend any screening for nonpregnant women who are considered low risk for pelvic cancer and who do not have symptoms. Ask your health care provider if a screening pelvic exam is right for you.  If you have had past treatment for cervical cancer or a condition that could lead to cancer, you need Pap tests and screening for cancer for at least 20 years after your treatment. If Pap tests have been discontinued, your risk factors (such as having a new sexual partner) need to be reassessed to determine if screening should resume. Some women have medical problems that increase the chance of getting cervical cancer. In these cases, your health care provider may recommend more  frequent screening and Pap tests.  Colorectal Cancer  This type of cancer can be detected and often prevented.  Routine colorectal cancer screening usually begins at 47 years of age and continues through 47 years of age.  Your health care provider may recommend screening at an earlier age if you have risk factors for colon cancer.  Your health care provider may also recommend using home test kits to check for hidden blood in the stool.  A small  camera at the end of a tube can be used to examine your colon directly (sigmoidoscopy or colonoscopy). This is done to check for the earliest forms of colorectal cancer.  Routine screening usually begins at age 85.  Direct examination of the colon should be repeated every 5-10 years through 47 years of age. However, you may need to be screened more often if early forms of precancerous polyps or small growths are found.  Skin Cancer  Check your skin from head to toe regularly.  Tell your health care provider about any new moles or changes in moles, especially if there is a change in a mole's shape or color.  Also tell your health care provider if you have a mole that is larger than the size of a pencil eraser.  Always use sunscreen. Apply sunscreen liberally and repeatedly throughout the day.  Protect yourself by wearing long sleeves, pants, a wide-brimmed hat, and sunglasses whenever you are outside.  Heart disease, diabetes, and high blood pressure  High blood pressure causes heart disease and increases the risk of stroke. High blood pressure is more likely to develop in: ? People who have blood pressure in the high end of the normal range (130-139/85-89 mm Hg). ? People who are overweight or obese. ? People who are African American.  If you are 34-76 years of age, have your blood pressure checked every 3-5 years. If you are 7 years of age or older, have your blood pressure checked every year. You should have your blood pressure measured  twice-once when you are at a hospital or clinic, and once when you are not at a hospital or clinic. Record the average of the two measurements. To check your blood pressure when you are not at a hospital or clinic, you can use: ? An automated blood pressure machine at a pharmacy. ? A home blood pressure monitor.  If you are between 90 years and 20 years old, ask your health care provider if you should take aspirin to prevent strokes.  Have regular diabetes screenings. This involves taking a blood sample to check your fasting blood sugar level. ? If you are at a normal weight and have a low risk for diabetes, have this test once every three years after 47 years of age. ? If you are overweight and have a high risk for diabetes, consider being tested at a younger age or more often. Preventing infection Hepatitis B  If you have a higher risk for hepatitis B, you should be screened for this virus. You are considered at high risk for hepatitis B if: ? You were born in a country where hepatitis B is common. Ask your health care provider which countries are considered high risk. ? Your parents were born in a high-risk country, and you have not been immunized against hepatitis B (hepatitis B vaccine). ? You have HIV or AIDS. ? You use needles to inject street drugs. ? You live with someone who has hepatitis B. ? You have had sex with someone who has hepatitis B. ? You get hemodialysis treatment. ? You take certain medicines for conditions, including cancer, organ transplantation, and autoimmune conditions.  Hepatitis C  Blood testing is recommended for: ? Everyone born from [birth year] through 1966. ? Anyone with known risk factors for hepatitis C.  Sexually transmitted infections (STIs)  You should be screened for sexually transmitted infections (STIs) including gonorrhea and chlamydia if: ? You are sexually active and are younger than 47 years of age. ?  You are older than 47 years of age and your  health care provider tells you that you are at risk for this type of infection. ? Your sexual activity has changed since you were last screened and you are at an increased risk for chlamydia or gonorrhea. Ask your health care provider if you are at risk.  If you do not have HIV, but are at risk, it may be recommended that you take a prescription medicine daily to prevent HIV infection. This is called pre-exposure prophylaxis (PrEP). You are considered at risk if: ? You are sexually active and do not regularly use condoms or know the HIV status of your partner(s). ? You take drugs by injection. ? You are sexually active with a partner who has HIV.  Talk with your health care provider about whether you are at high risk of being infected with HIV. If you choose to begin PrEP, you should first be tested for HIV. You should then be tested every 3 months for as long as you are taking PrEP. Pregnancy  If you are premenopausal and you may become pregnant, ask your health care provider about preconception counseling.  If you may become pregnant, take 400 to 800 micrograms (mcg) of folic acid every day.  If you want to prevent pregnancy, talk to your health care provider about birth control (contraception). Osteoporosis and menopause  Osteoporosis is a disease in which the bones lose minerals and strength with aging. This can result in serious bone fractures. Your risk for osteoporosis can be identified using a bone density scan.  If you are 14 years of age or older, or if you are at risk for osteoporosis and fractures, ask your health care provider if you should be screened.  Ask your health care provider whether you should take a calcium or vitamin D supplement to lower your risk for osteoporosis.  Menopause may have certain physical symptoms and risks.  Hormone replacement therapy may reduce some of these symptoms and risks. Talk to your health care provider about whether hormone replacement  therapy is right for you. Follow these instructions at home:  Schedule regular health, dental, and eye exams.  Stay current with your immunizations.  Do not use any tobacco products including cigarettes, chewing tobacco, or electronic cigarettes.  If you are pregnant, do not drink alcohol.  If you are breastfeeding, limit how much and how often you drink alcohol.  Limit alcohol intake to no more than 1 drink per day for nonpregnant women. One drink equals 12 ounces of beer, 5 ounces of wine, or 1 ounces of hard liquor.  Do not use street drugs.  Do not share needles.  Ask your health care provider for help if you need support or information about quitting drugs.  Tell your health care provider if you often feel depressed.  Tell your health care provider if you have ever been abused or do not feel safe at home. This information is not intended to replace advice given to you by your health care provider. Make sure you discuss any questions you have with your health care provider. Document Released: 12/17/2010 Document Revised: 11/09/2015 Document Reviewed: 03/07/2015 Elsevier Interactive Patient Education  2018 [employer] NOW OFFER   [facility] FAST TRACK!!!  SAME DAY Appointments for ACUTE CARE  Such as: Sprains, Injuries, cuts, abrasions, rashes, muscle pain, joint pain, back pain Colds, flu, sore throats, headache, allergies, cough, fever  Ear pain, sinus and eye infections Abdominal pain, nausea,  vomiting, diarrhea, upset stomach Animal/insect bites  3 Easy Ways to Schedule: Walk-In Scheduling Call in scheduling Mychart Sign-up: [email or web address]           No Follow-up on file.  Colin Benton R., DO

## 2017-02-20 ENCOUNTER — Encounter: Payer: Self-pay | Admitting: Family Medicine

## 2017-02-20 ENCOUNTER — Ambulatory Visit (INDEPENDENT_AMBULATORY_CARE_PROVIDER_SITE_OTHER): Payer: BLUE CROSS/BLUE SHIELD | Admitting: Family Medicine

## 2017-02-20 VITALS — BP 82/58 | HR 64 | Temp 98.2°F | Ht 64.5 in | Wt 135.0 lb

## 2017-02-20 DIAGNOSIS — Z0000 Encounter for general adult medical examination without abnormal findings: Secondary | ICD-10-CM

## 2017-02-20 DIAGNOSIS — J301 Allergic rhinitis due to pollen: Secondary | ICD-10-CM | POA: Diagnosis not present

## 2017-02-20 DIAGNOSIS — Z1331 Encounter for screening for depression: Secondary | ICD-10-CM

## 2017-02-20 DIAGNOSIS — Z1389 Encounter for screening for other disorder: Secondary | ICD-10-CM | POA: Diagnosis not present

## 2017-02-20 NOTE — Patient Instructions (Signed)
BEFORE YOU LEAVE: -follow PN:TIRWER for physical and as needed  Vitamin D3 [phone] international units daily, particularly through the fall, winter and spring.  Get your mammogram in January.  Health Maintenance, Female Adopting a healthy lifestyle and getting preventive care can go a long way to promote health and wellness. Talk with your health care provider about what schedule of regular examinations is right for you. This is a good chance for you to check in with your provider about disease prevention and staying healthy. In between checkups, there are plenty of things you can do on your own. Experts have done a lot of research about which lifestyle changes and preventive measures are most likely to keep you healthy. Ask your health care provider for more information. Weight and diet Eat a healthy diet  Be sure to include plenty of vegetables, fruits, low-fat dairy products, and lean protein.  Do not eat a lot of foods high in solid fats, added sugars, or salt.  Get regular exercise. This is one of the most important things you can do for your health. ? Most adults should exercise for at least 150 minutes each week. The exercise should increase your heart rate and make you sweat (moderate-intensity exercise). ? Most adults should also do strengthening exercises at least twice a week. This is in addition to the moderate-intensity exercise.  Maintain a healthy weight  Body mass index (BMI) is a measurement that can be used to identify possible weight problems. It estimates body fat based on height and weight. Your health care provider can help determine your BMI and help you achieve or maintain a healthy weight.  For females 17 years of age and older: ? A BMI below 18.5 is considered underweight. ? A BMI of 18.5 to 24.9 is normal. ? A BMI of 25 to 29.9 is considered overweight. ? A BMI of 30 and above is considered obese.  Watch levels of cholesterol and blood lipids  You should  start having your blood tested for lipids and cholesterol at 47 years of age, then have this test every 5 years.  You may need to have your cholesterol levels checked more often if: ? Your lipid or cholesterol levels are high. ? You are older than 47 years of age. ? You are at high risk for heart disease.  Cancer screening Lung Cancer  Lung cancer screening is recommended for adults 70-39 years old who are at high risk for lung cancer because of a history of smoking.  A yearly low-dose CT scan of the lungs is recommended for people who: ? Currently smoke. ? Have quit within the past 15 years. ? Have at least a 30-pack-year history of smoking. A pack year is smoking an average of one pack of cigarettes a day for 1 year.  Yearly screening should continue until it has been 15 years since you quit.  Yearly screening should stop if you develop a health problem that would prevent you from having lung cancer treatment.  Breast Cancer  Practice breast self-awareness. This means understanding how your breasts normally appear and feel.  It also means doing regular breast self-exams. Let your health care provider know about any changes, no matter how small.  If you are in your 20s or 30s, you should have a clinical breast exam (CBE) by a health care provider every 1-3 years as part of a regular health exam.  If you are 35 or older, have a CBE every year. Also consider having a  breast X-ray (mammogram) every year.  If you have a family history of breast cancer, talk to your health care provider about genetic screening.  If you are at high risk for breast cancer, talk to your health care provider about having an MRI and a mammogram every year.  Breast cancer gene (BRCA) assessment is recommended for women who have family members with BRCA-related cancers. BRCA-related cancers include: ? Breast. ? Ovarian. ? Tubal. ? Peritoneal cancers.  Results of the assessment will determine the need  for genetic counseling and BRCA1 and BRCA2 testing.  Cervical Cancer Your health care provider may recommend that you be screened regularly for cancer of the pelvic organs (ovaries, uterus, and vagina). This screening involves a pelvic examination, including checking for microscopic changes to the surface of your cervix (Pap test). You may be encouraged to have this screening done every 3 years, beginning at age 19.  For women ages 54-65, health care providers may recommend pelvic exams and Pap testing every 3 years, or they may recommend the Pap and pelvic exam, combined with testing for human papilloma virus (HPV), every 5 years. Some types of HPV increase your risk of cervical cancer. Testing for HPV may also be done on women of any age with unclear Pap test results.  Other health care providers may not recommend any screening for nonpregnant women who are considered low risk for pelvic cancer and who do not have symptoms. Ask your health care provider if a screening pelvic exam is right for you.  If you have had past treatment for cervical cancer or a condition that could lead to cancer, you need Pap tests and screening for cancer for at least 20 years after your treatment. If Pap tests have been discontinued, your risk factors (such as having a new sexual partner) need to be reassessed to determine if screening should resume. Some women have medical problems that increase the chance of getting cervical cancer. In these cases, your health care provider may recommend more frequent screening and Pap tests.  Colorectal Cancer  This type of cancer can be detected and often prevented.  Routine colorectal cancer screening usually begins at 47 years of age and continues through 47 years of age.  Your health care provider may recommend screening at an earlier age if you have risk factors for colon cancer.  Your health care provider may also recommend using home test kits to check for hidden blood in  the stool.  A small camera at the end of a tube can be used to examine your colon directly (sigmoidoscopy or colonoscopy). This is done to check for the earliest forms of colorectal cancer.  Routine screening usually begins at age 36.  Direct examination of the colon should be repeated every 5-10 years through 47 years of age. However, you may need to be screened more often if early forms of precancerous polyps or small growths are found.  Skin Cancer  Check your skin from head to toe regularly.  Tell your health care provider about any new moles or changes in moles, especially if there is a change in a mole's shape or color.  Also tell your health care provider if you have a mole that is larger than the size of a pencil eraser.  Always use sunscreen. Apply sunscreen liberally and repeatedly throughout the day.  Protect yourself by wearing long sleeves, pants, a wide-brimmed hat, and sunglasses whenever you are outside.  Heart disease, diabetes, and high blood pressure  High  blood pressure causes heart disease and increases the risk of stroke. High blood pressure is more likely to develop in: ? People who have blood pressure in the high end of the normal range (130-139/85-89 mm Hg). ? People who are overweight or obese. ? People who are African American.  If you are 43-71 years of age, have your blood pressure checked every 3-5 years. If you are 34 years of age or older, have your blood pressure checked every year. You should have your blood pressure measured twice-once when you are at a hospital or clinic, and once when you are not at a hospital or clinic. Record the average of the two measurements. To check your blood pressure when you are not at a hospital or clinic, you can use: ? An automated blood pressure machine at a pharmacy. ? A home blood pressure monitor.  If you are between 36 years and 71 years old, ask your health care provider if you should take aspirin to prevent  strokes.  Have regular diabetes screenings. This involves taking a blood sample to check your fasting blood sugar level. ? If you are at a normal weight and have a low risk for diabetes, have this test once every three years after 47 years of age. ? If you are overweight and have a high risk for diabetes, consider being tested at a younger age or more often. Preventing infection Hepatitis B  If you have a higher risk for hepatitis B, you should be screened for this virus. You are considered at high risk for hepatitis B if: ? You were born in a country where hepatitis B is common. Ask your health care provider which countries are considered high risk. ? Your parents were born in a high-risk country, and you have not been immunized against hepatitis B (hepatitis B vaccine). ? You have HIV or AIDS. ? You use needles to inject street drugs. ? You live with someone who has hepatitis B. ? You have had sex with someone who has hepatitis B. ? You get hemodialysis treatment. ? You take certain medicines for conditions, including cancer, organ transplantation, and autoimmune conditions.  Hepatitis C  Blood testing is recommended for: ? Everyone born from [birth year] through 1966. ? Anyone with known risk factors for hepatitis C.  Sexually transmitted infections (STIs)  You should be screened for sexually transmitted infections (STIs) including gonorrhea and chlamydia if: ? You are sexually active and are younger than 47 years of age. ? You are older than 47 years of age and your health care provider tells you that you are at risk for this type of infection. ? Your sexual activity has changed since you were last screened and you are at an increased risk for chlamydia or gonorrhea. Ask your health care provider if you are at risk.  If you do not have HIV, but are at risk, it may be recommended that you take a prescription medicine daily to prevent HIV infection. This is called pre-exposure prophylaxis  (PrEP). You are considered at risk if: ? You are sexually active and do not regularly use condoms or know the HIV status of your partner(s). ? You take drugs by injection. ? You are sexually active with a partner who has HIV.  Talk with your health care provider about whether you are at high risk of being infected with HIV. If you choose to begin PrEP, you should first be tested for HIV. You should then be tested every 3 months for  as long as you are taking PrEP. Pregnancy  If you are premenopausal and you may become pregnant, ask your health care provider about preconception counseling.  If you may become pregnant, take 400 to 800 micrograms (mcg) of folic acid every day.  If you want to prevent pregnancy, talk to your health care provider about birth control (contraception). Osteoporosis and menopause  Osteoporosis is a disease in which the bones lose minerals and strength with aging. This can result in serious bone fractures. Your risk for osteoporosis can be identified using a bone density scan.  If you are 33 years of age or older, or if you are at risk for osteoporosis and fractures, ask your health care provider if you should be screened.  Ask your health care provider whether you should take a calcium or vitamin D supplement to lower your risk for osteoporosis.  Menopause may have certain physical symptoms and risks.  Hormone replacement therapy may reduce some of these symptoms and risks. Talk to your health care provider about whether hormone replacement therapy is right for you. Follow these instructions at home:  Schedule regular health, dental, and eye exams.  Stay current with your immunizations.  Do not use any tobacco products including cigarettes, chewing tobacco, or electronic cigarettes.  If you are pregnant, do not drink alcohol.  If you are breastfeeding, limit how much and how often you drink alcohol.  Limit alcohol intake to no more than 1 drink per day for  nonpregnant women. One drink equals 12 ounces of beer, 5 ounces of wine, or 1 ounces of hard liquor.  Do not use street drugs.  Do not share needles.  Ask your health care provider for help if you need support or information about quitting drugs.  Tell your health care provider if you often feel depressed.  Tell your health care provider if you have ever been abused or do not feel safe at home. This information is not intended to replace advice given to you by your health care provider. Make sure you discuss any questions you have with your health care provider. Document Released: 12/17/2010 Document Revised: 11/09/2015 Document Reviewed: 03/07/2015 Elsevier Interactive Patient Education  2018 [place] NOW OFFER   [facility] FAST TRACK!!!  SAME DAY Appointments for ACUTE CARE  Such as: Sprains, Injuries, cuts, abrasions, rashes, muscle pain, joint pain, back pain Colds, flu, sore throats, headache, allergies, cough, fever  Ear pain, sinus and eye infections Abdominal pain, nausea, vomiting, diarrhea, upset stomach Animal/insect bites  3 Easy Ways to Schedule: Walk-In Scheduling Call in scheduling Mychart Sign-up: [email or web address]

## 2017-08-19 ENCOUNTER — Other Ambulatory Visit: Payer: Self-pay | Admitting: Family Medicine

## 2017-08-19 DIAGNOSIS — Z139 Encounter for screening, unspecified: Secondary | ICD-10-CM

## 2017-09-05 ENCOUNTER — Ambulatory Visit
Admission: RE | Admit: 2017-09-05 | Discharge: 2017-09-05 | Disposition: A | Discharge status: Home / Self Care | Payer: BLUE CROSS/BLUE SHIELD | Source: Ambulatory Visit | Attending: Family Medicine | Admitting: Family Medicine

## 2017-09-05 DIAGNOSIS — Z139 Encounter for screening, unspecified: Secondary | ICD-10-CM

## 2017-09-05 DIAGNOSIS — Z1231 Encounter for screening mammogram for malignant neoplasm of breast: Secondary | ICD-10-CM | POA: Diagnosis not present

## 2017-12-10 DIAGNOSIS — J301 Allergic rhinitis due to pollen: Secondary | ICD-10-CM | POA: Diagnosis not present

## 2017-12-10 DIAGNOSIS — J3081 Allergic rhinitis due to animal (cat) (dog) hair and dander: Secondary | ICD-10-CM | POA: Diagnosis not present

## 2017-12-10 DIAGNOSIS — J3089 Other allergic rhinitis: Secondary | ICD-10-CM | POA: Diagnosis not present

## 2017-12-10 DIAGNOSIS — R05 Cough: Secondary | ICD-10-CM | POA: Diagnosis not present

## 2018-02-25 NOTE — Progress Notes (Deleted)
HPI:  Using dictation device. Unfortunately this device frequently misinterprets words/phrases.  Here for CPE:  -Concerns and/or follow up today: none ***  Chronic medical problems summarized below were reviewed for changes.***.   -Diet: variety of foods, balance and well rounded, larger portion sizes -Exercise: no regular exercise -Taking folic acid, vitamin D or calcium: no -Diabetes and Dyslipidemia Screening: *** -Vaccines: see vaccine section EPIC -pap history: 02/2016 pap normal and hpv neg -FDLMP: see nursing notes -sexual activity: yes, female partner, no new partners -wants STI testing (Hep C if born 20-65): no -FH breast, colon or ovarian ca: see FH Last mammogram: *** Last colon cancer screening: *** Breast Ca Risk Assessment: see family history and pt history DEXA (>/= 22): ***  -Alcohol, Tobacco, drug use: see social history  Review of Systems - no fevers, unintentional weight loss, vision loss, hearing loss, chest pain, sob, hemoptysis, melena, hematochezia, hematuria, genital discharge, changing or concerning skin lesions, bleeding, bruising, loc, thoughts of self harm or SI  Past Medical History:  Diagnosis Date  . Allergy   . Asthma     No past surgical history on file.  Family History  Problem Relation Age of Onset  . Alcoholism Father   . Rheum arthritis Mother   . Breast cancer Mother 98  . Bipolar disorder Maternal Uncle     Social History   Socioeconomic History  . Marital status: Single    Spouse name: Not on file  . Number of children: Not on file  . Years of education: Not on file  . Highest education level: Not on file  Occupational History  . Not on file  Social Needs  . Financial resource strain: Not on file  . Food insecurity:    Worry: Not on file    Inability: Not on file  . Transportation needs:    Medical: Not on file    Non-medical: Not on file  Tobacco Use  . Smoking status: Never Smoker  . Smokeless tobacco: Never  Used  Substance and Sexual Activity  . Alcohol use: Not on file  . Drug use: Not on file  . Sexual activity: Not on file  Lifestyle  . Physical activity:    Days per week: Not on file    Minutes per session: Not on file  . Stress: Not on file  Relationships  . Social connections:    Talks on phone: Not on file    Gets together: Not on file    Attends religious service: Not on file    Active member of club or organization: Not on file    Attends meetings of clubs or organizations: Not on file    Relationship status: Not on file  Other Topics Concern  . Not on file  Social History Narrative   Work or School: [employer], farm sitting, teaches horse back riding, works for Emergency planning/management officer Situation: lives with daughter      Spiritual Beliefs:      Lifestyle: no regular aerobic activity, eats healthy        Current Outpatient Medications:  .  fluticasone (FLOVENT HFA) 44 MCG/ACT inhaler, Inhale 2 puffs into the lungs 2 (two) times daily., Disp: 1 Inhaler, Rfl: 12 .  levocetirizine (XYZAL) 5 MG tablet, TK 1 T PO QD IN THE EVE, Disp: , Rfl: 5 .  montelukast (SINGULAIR) 10 MG tablet, TK 1 T PO QD IN THE EVE, Disp: , Rfl: 6  EXAM:  There were no vitals filed for this visit.  GENERAL: vitals reviewed and listed below, alert, oriented, appears well hydrated and in no acute distress  HEENT: head atraumatic, PERRLA, normal appearance of eyes, ears, nose and mouth. moist mucus membranes.  NECK: supple, no masses or lymphadenopathy  LUNGS: clear to auscultation bilaterally, no rales, rhonchi or wheeze  CV: HRRR, no peripheral edema or cyanosis, normal pedal pulses  ABDOMEN: bowel sounds normal, soft, non tender to palpation, no masses, no rebound or guarding  GU/BREAST: ***  SKIN: no rash or abnormal lesions  MS: normal gait, moves all extremities normally  NEURO: normal gait, speech and thought processing grossly intact, muscle tone grossly intact  throughout  PSYCH: normal affect, pleasant and cooperative  ASSESSMENT AND PLAN:  Discussed the following assessment and plan:  PREVENTIVE EXAM: -Discussed and advised all [place] preventive services health task force level A and B recommendations for age, sex and risks. -Advised at least 150 minutes of exercise per week and a healthy diet with avoidance of (less then 1 serving per week) processed foods, white starches, red meat, fast foods and sweets and consisting of: * 5-9 servings of fresh fruits and vegetables (not corn or potatoes) *nuts and seeds, beans *olives and olive oil *lean meats such as fish and white chicken  *whole grains -labs, studies and vaccines per orders this encounter  There are no diagnoses linked to this encounter. ***  Patient advised to return to clinic immediately if symptoms worsen or persist or new concerns.  There are no Patient Instructions on file for this visit.  No follow-ups on file.  Terressa Koyanagi, DO

## 2018-02-26 ENCOUNTER — Encounter: Payer: BLUE CROSS/BLUE SHIELD | Admitting: Family Medicine

## 2018-02-26 DIAGNOSIS — Z0289 Encounter for other administrative examinations: Secondary | ICD-10-CM

## 2019-05-19 IMAGING — MG DIGITAL SCREENING BILATERAL MAMMOGRAM WITH TOMO AND CAD
8 series · 9 of 24 positions shown · non-contrast
Comparison: Previous exam(s).

CLINICAL DATA: Screening.

EXAM:
DIGITAL SCREENING BILATERAL MAMMOGRAM WITH TOMO AND CAD

[R CC synth-2D]
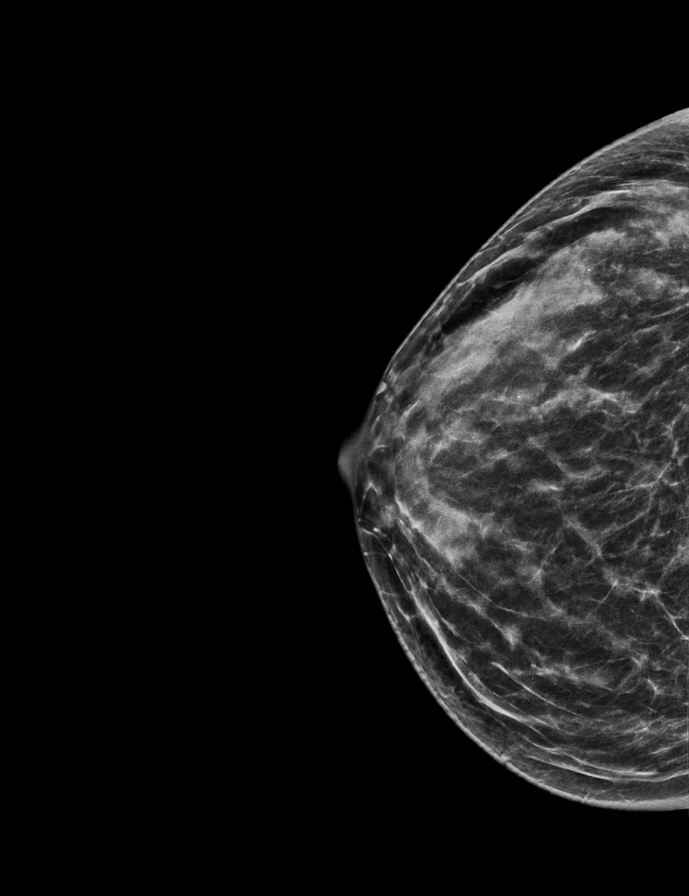

[L MLO synth-2D]
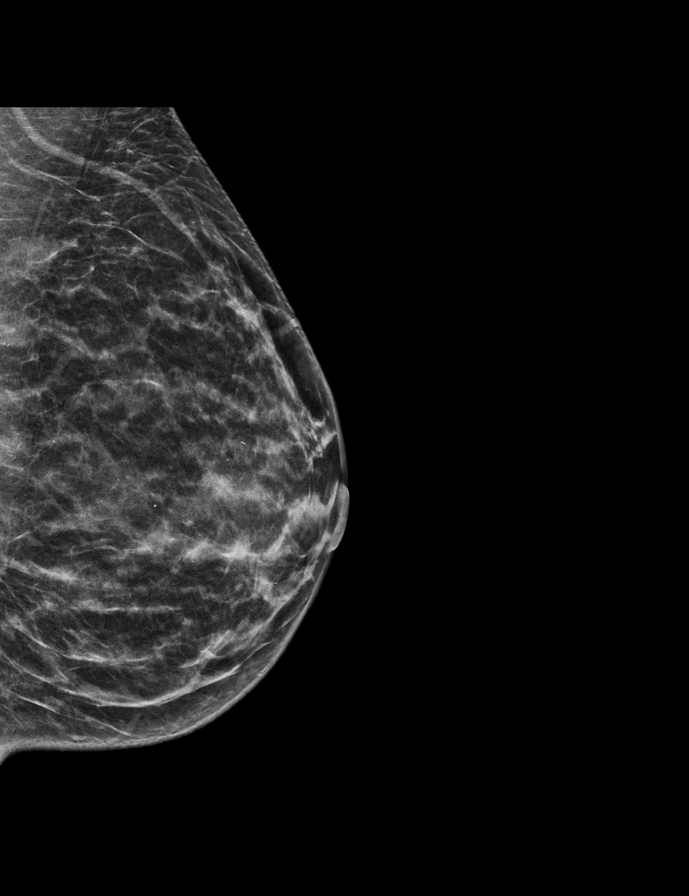

[L CC synth-2D]
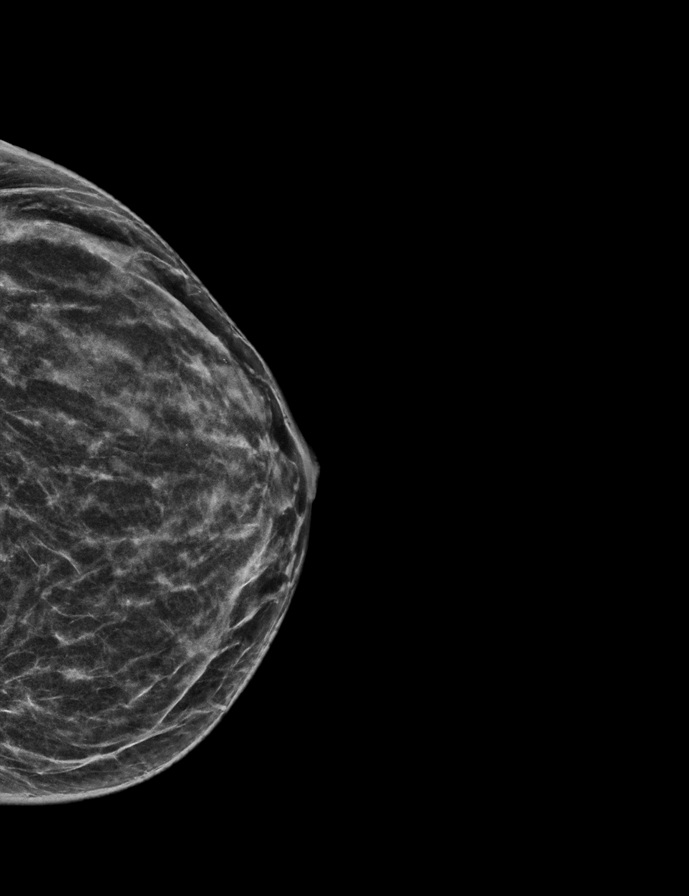

[R MLO synth-2D]
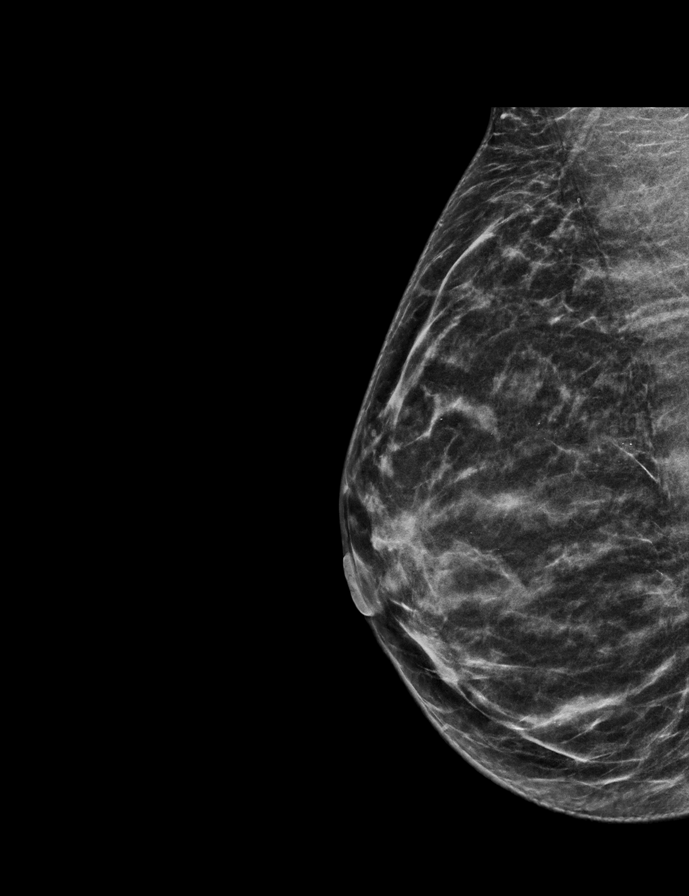

[L CC tomo · 2 of 46 frames shown]
[frame 15/46]
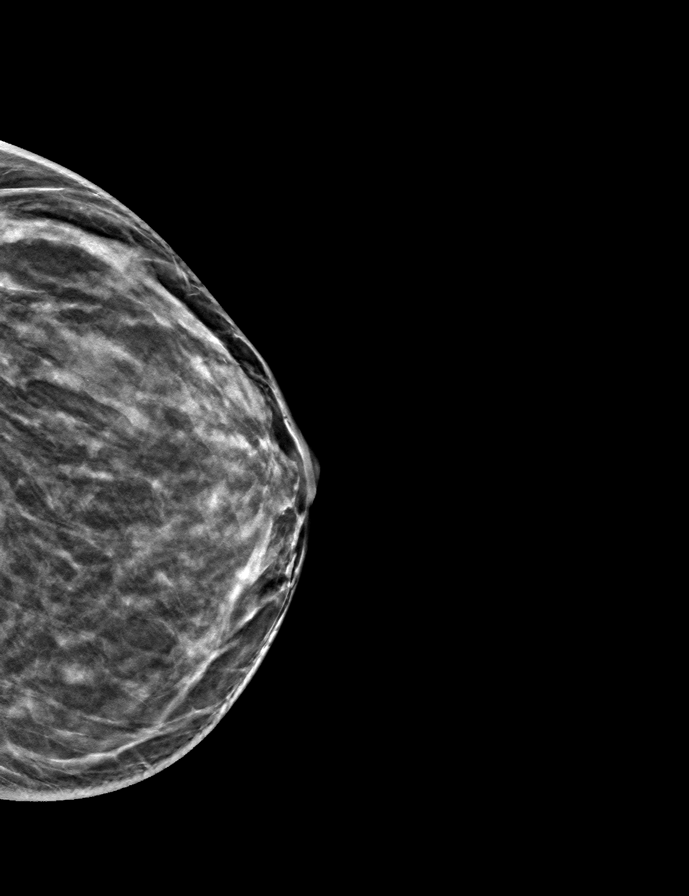
[frame 23/46]
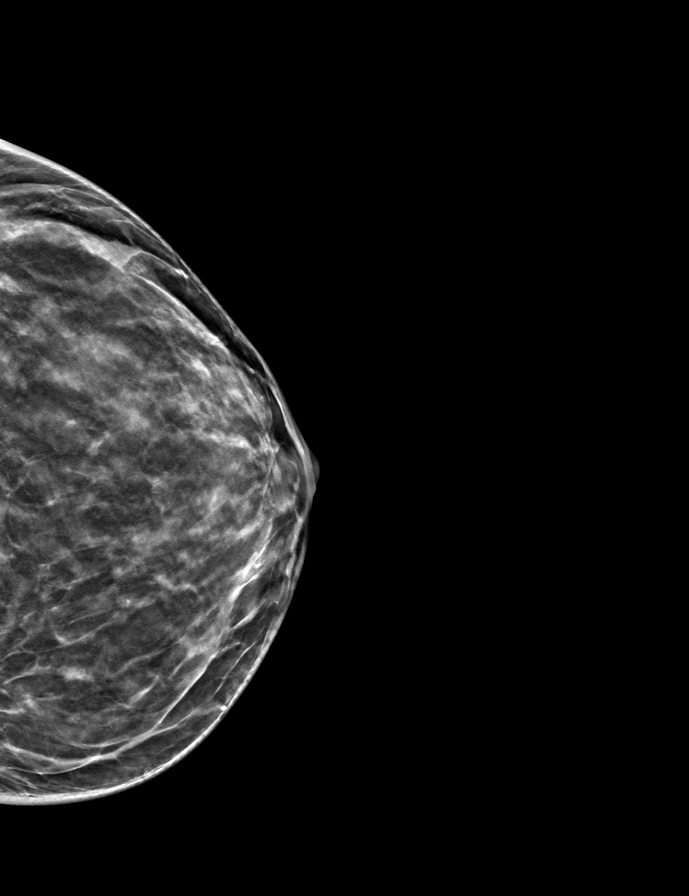

[R CC tomo · tomo slice 25/50.0]
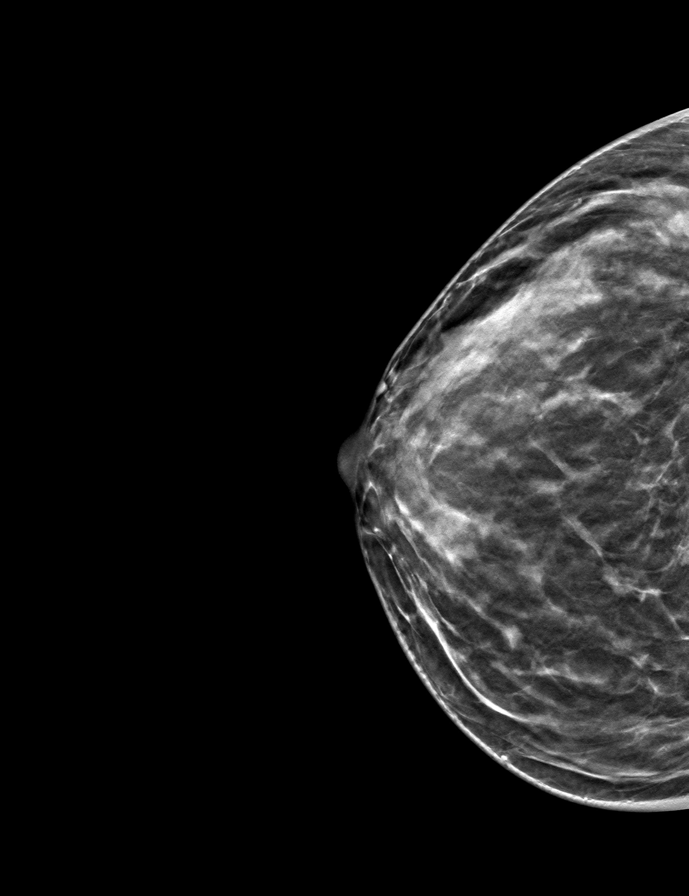

[L MLO tomo · tomo slice 24/47.0]
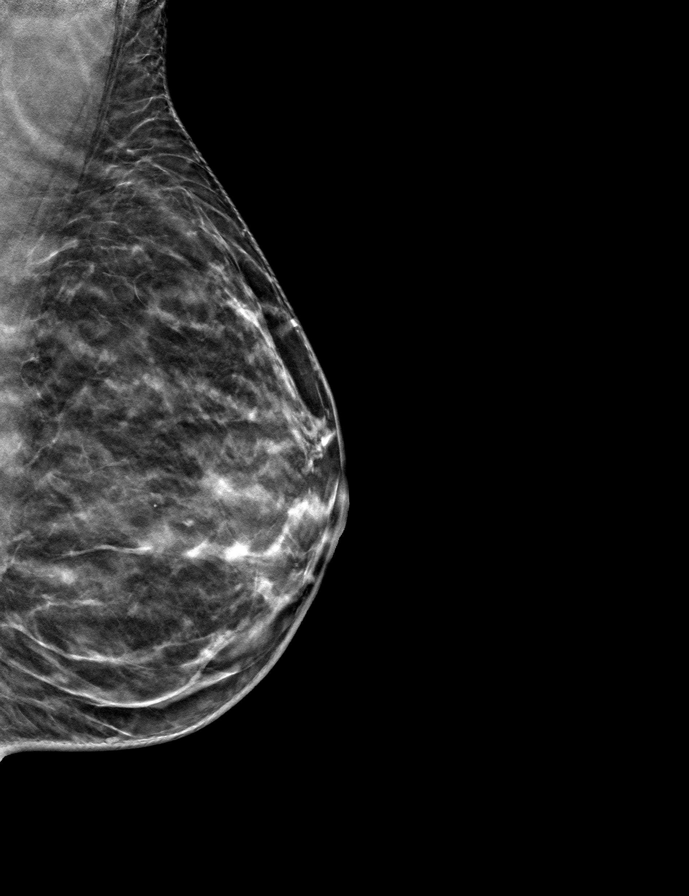

[R MLO tomo · tomo slice 25/50.0]
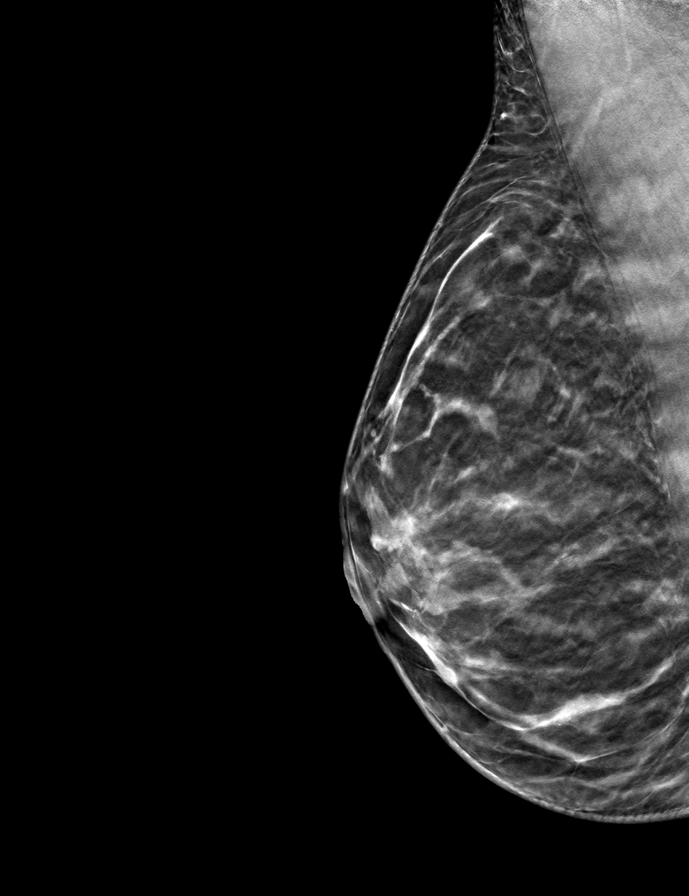

[9 of 24 positions shown; findings below may reference images not displayed]

ACR Breast Density Category c: The breast tissue is heterogeneously
dense, which may obscure small masses.
FINDINGS: There are no findings suspicious for malignancy. Images were
processed with CAD.
IMPRESSION: No mammographic evidence of malignancy. A result letter of this
screening mammogram will be mailed directly to the patient.

RECOMMENDATION:
Screening mammogram in one year. (Code:[id number])

BI-RADS CATEGORY  1: Negative.

## 2019-12-28 ENCOUNTER — Ambulatory Visit: Payer: 59 | Admitting: Internal Medicine

## 2019-12-28 ENCOUNTER — Other Ambulatory Visit: Payer: Self-pay

## 2019-12-28 VITALS — BP 110/80 | HR 67 | Temp 98.2°F | Ht 65.0 in | Wt 130.9 lb

## 2019-12-28 DIAGNOSIS — Z23 Encounter for immunization: Secondary | ICD-10-CM

## 2019-12-28 DIAGNOSIS — Z0000 Encounter for general adult medical examination without abnormal findings: Secondary | ICD-10-CM | POA: Diagnosis not present

## 2019-12-28 DIAGNOSIS — Z1231 Encounter for screening mammogram for malignant neoplasm of breast: Secondary | ICD-10-CM

## 2019-12-28 DIAGNOSIS — Z803 Family history of malignant neoplasm of breast: Secondary | ICD-10-CM

## 2019-12-28 DIAGNOSIS — Z1211 Encounter for screening for malignant neoplasm of colon: Secondary | ICD-10-CM

## 2019-12-28 NOTE — Patient Instructions (Signed)
-Nice seeing you today!!  -Lab work today; will notify you once results are available.  -Tetanus booster today.  -Due for shingles vaccination series.  -Mammogram, colonoscopy and GYN referrals to be scheduled.  -Return in 1 year or sooner as needed.   Preventive Care 40-50 Years Old, Female Preventive care refers to visits with your health care provider and lifestyle choices that can promote health and wellness. This includes:  A yearly physical exam. This may also be called an annual well check.  Regular dental visits and eye exams.  Immunizations.  Screening for certain conditions.  Healthy lifestyle choices, such as eating a healthy diet, getting regular exercise, not using drugs or products that contain nicotine and tobacco, and limiting alcohol use. What can I expect for my preventive care visit? Physical exam Your health care provider will check your:  Height and weight. This may be used to calculate body mass index (BMI), which tells if you are at a healthy weight.  Heart rate and blood pressure.  Skin for abnormal spots. Counseling Your health care provider may ask you questions about your:  Alcohol, tobacco, and drug use.  Emotional well-being.  Home and relationship well-being.  Sexual activity.  Eating habits.  Work and work Statistician.  Method of birth control.  Menstrual cycle.  Pregnancy history. What immunizations do I need?  Influenza (flu) vaccine  This is recommended every year. Tetanus, diphtheria, and pertussis (Tdap) vaccine  You may need a Td booster every 10 years. Varicella (chickenpox) vaccine  You may need this if you have not been vaccinated. Zoster (shingles) vaccine  You may need this after age 32. Measles, mumps, and rubella (MMR) vaccine  You may need at least one dose of MMR if you were born in 1957 or later. You may also need a second dose. Pneumococcal conjugate (PCV13) vaccine  You may need this if you have  certain conditions and were not previously vaccinated. Pneumococcal polysaccharide (PPSV23) vaccine  You may need one or two doses if you smoke cigarettes or if you have certain conditions. Meningococcal conjugate (MenACWY) vaccine  You may need this if you have certain conditions. Hepatitis A vaccine  You may need this if you have certain conditions or if you travel or work in places where you may be exposed to hepatitis A. Hepatitis B vaccine  You may need this if you have certain conditions or if you travel or work in places where you may be exposed to hepatitis B. Haemophilus influenzae type b (Hib) vaccine  You may need this if you have certain conditions. Human papillomavirus (HPV) vaccine  If recommended by your health care provider, you may need three doses over 6 months. You may receive vaccines as individual doses or as more than one vaccine together in one shot (combination vaccines). Talk with your health care provider about the risks and benefits of combination vaccines. What tests do I need? Blood tests  Lipid and cholesterol levels. These may be checked every 5 years, or more frequently if you are over 56 years old.  Hepatitis C test.  Hepatitis B test. Screening  Lung cancer screening. You may have this screening every year starting at age 85 if you have a 30-pack-year history of smoking and currently smoke or have quit within the past 15 years.  Colorectal cancer screening. All adults should have this screening starting at age 20 and continuing until age 54. Your health care provider may recommend screening at age 26 if you are  at increased risk. You will have tests every 1-10 years, depending on your results and the type of screening test.  Diabetes screening. This is done by checking your blood sugar (glucose) after you have not eaten for a while (fasting). You may have this done every 1-3 years.  Mammogram. This may be done every 1-2 years. Talk with your  health care provider about when you should start having regular mammograms. This may depend on whether you have a family history of breast cancer.  BRCA-related cancer screening. This may be done if you have a family history of breast, ovarian, tubal, or peritoneal cancers.  Pelvic exam and Pap test. This may be done every 3 years starting at age 5. Starting at age 75, this may be done every 5 years if you have a Pap test in combination with an HPV test. Other tests  Sexually transmitted disease (STD) testing.  Bone density scan. This is done to screen for osteoporosis. You may have this scan if you are at high risk for osteoporosis. Follow these instructions at home: Eating and drinking  Eat a diet that includes fresh fruits and vegetables, whole grains, lean protein, and low-fat dairy.  Take vitamin and mineral supplements as recommended by your health care provider.  Do not drink alcohol if: ? Your health care provider tells you not to drink. ? You are pregnant, may be pregnant, or are planning to become pregnant.  If you drink alcohol: ? Limit how much you have to 0-1 drink a day. ? Be aware of how much alcohol is in your drink. In the [place] one drink equals one 12 oz bottle of beer (355 mL), one 5 oz glass of wine (148 mL), or one 1 oz glass of hard liquor (44 mL). Lifestyle  Take daily care of your teeth and gums.  Stay active. Exercise for at least 30 minutes on 5 or more days each week.  Do not use any products that contain nicotine or tobacco, such as cigarettes, e-cigarettes, and chewing tobacco. If you need help quitting, ask your health care provider.  If you are sexually active, practice safe sex. Use a condom or other form of birth control (contraception) in order to prevent pregnancy and STIs (sexually transmitted infections).  If told by your health care provider, take low-dose aspirin daily starting at age 39. What's next?  Visit your health care provider once  a year for a well check visit.  Ask your health care provider how often you should have your eyes and teeth checked.  Stay up to date on all vaccines. This information is not intended to replace advice given to you by your health care provider. Make sure you discuss any questions you have with your health care provider. Document Revised: 02/12/2018 Document Reviewed: 02/12/2018 Elsevier Patient Education  2020 [employer].

## 2019-12-28 NOTE — Progress Notes (Signed)
6 

## 2019-12-28 NOTE — Addendum Note (Signed)
Addended by: Kern Reap B on: 12/28/2019 04:06 PM   Modules accepted: Orders

## 2019-12-28 NOTE — Progress Notes (Signed)
Established Patient Office Visit     This visit occurred during the SARS-CoV-2 public health emergency.  Safety protocols were in place, including screening questions prior to the visit, additional usage of staff PPE, and extensive cleaning of exam room while observing appropriate contact time as indicated for disinfecting solutions.    CC/Reason for Visit: Annual preventive exam  HPI: Cathy Weaver is a 49 y.o. female who is coming in today for the above mentioned reasons. 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  She is here to establish care and also for her annual preventive exam.  She has routine dental care but no eye care.  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  She had her Covid vaccines, she is due for Tdap and shingles, all cancer screenings are due.  She wonders if she needs any specialized heart testing given her prior cocaine use, she does not have chest pain, shortness of breath or leg edema.   Past Medical/Surgical History: Past Medical History:  Diagnosis Date  . Allergy   . Asthma     No past surgical history on file.  Social History:  reports that she has never smoked. She has never used smokeless tobacco. No history on file for alcohol use and drug use.  Allergies: No Known Allergies  Family History:  Family History  Problem Relation Age of Onset  . Alcoholism Father   . Rheum arthritis Mother   . Breast cancer Mother 62  . Bipolar disorder Maternal Uncle      Current Outpatient Medications:  .  fluticasone (FLOVENT HFA) 44 MCG/ACT inhaler, Inhale 2 puffs into the lungs 2 (two) times daily., Disp: 1 Inhaler, Rfl: 12 .  loratadine (CLARITIN) 10 MG tablet, Take 10 mg by mouth daily., Disp: , Rfl:   Review of Systems:  Constitutional: Denies fever, chills, diaphoresis, appetite change and fatigue.  HEENT: Denies photophobia, eye pain,  redness, hearing loss, ear pain, congestion, sore throat, rhinorrhea, sneezing, mouth sores, trouble swallowing, neck pain, neck stiffness and tinnitus.   Respiratory: Denies SOB, DOE, cough, chest tightness,  and wheezing.   Cardiovascular: Denies chest pain, palpitations and leg swelling.  Gastrointestinal: Denies nausea, vomiting, abdominal pain, diarrhea, constipation, blood in stool and abdominal distention.  Genitourinary: Denies dysuria, urgency, frequency, hematuria, flank pain and difficulty urinating.  Endocrine: Denies: hot or cold intolerance, sweats, changes in hair or nails, polyuria, polydipsia. Musculoskeletal: Denies myalgias, back pain, joint swelling, arthralgias and gait problem.  Skin: Denies pallor, rash and wound.  Neurological: Denies dizziness, seizures, syncope, weakness, light-headedness, numbness and headaches.  Hematological: Denies adenopathy. Easy bruising, personal or family bleeding history  Psychiatric/Behavioral: Denies suicidal ideation, mood changes, confusion, nervousness, sleep disturbance and agitation    Physical Exam: Vitals:   12/28/19 1305  BP: 110/80  Pulse: 67  Temp: 98.2 F (36.8 C)  TempSrc: Oral  SpO2: 97%  Weight: 130 lb 14.4 oz (59.4 kg)  Height: _0  (1.651 m)    Body mass index is 21.78 kg/m.   Constitutional: NAD, calm, comfortable Eyes: PERRL, lids and conjunctivae normal ENMT: Mucous membranes are moist.  Tympanic membrane is pearly white, no erythema or bulging. Neck: normal, supple, no masses, no thyromegaly Respiratory: clear to auscultation bilaterally, no wheezing, no crackles. Normal respiratory effort. No accessory muscle use.  Cardiovascular: Regular rate and rhythm, no murmurs / rubs / gallops. No extremity edema.  2+ pedal pulses.  Abdomen: no tenderness, no masses palpated. No hepatosplenomegaly. Bowel sounds positive.  Musculoskeletal: no clubbing / cyanosis. No joint deformity upper and lower extremities. Good  ROM, no contractures. Normal muscle tone.  Skin: no rashes, lesions, ulcers. No induration Neurologic: CN 2-12 grossly intact. Sensation intact, DTR normal. Strength 5/5 in all 4.  Psychiatric: Normal judgment and insight. Alert and oriented x 3. Normal mood.    Impression and Plan:  Encounter for preventive health examination -Advised routine eye and dental care. -Tdap booster today, also due for shingles vaccination series. -Screening labs today. -Healthy lifestyle discussed in detail. -Overdue for colonoscopy, mammogram and Pap smear.  She will make appointment with GYN as she has an IUD and needs to be exchanged.  Screening for malignant neoplasm of colon  - Plan: Ambulatory referral to Gastroenterology  Encounter for screening mammogram for malignant neoplasm of breast  - Plan: MM Digital Screening    Patient Instructions  -Nice seeing you today!!  -Lab work today; will notify you once results are available.  -Tetanus booster today.  -Due for shingles vaccination series.  -Mammogram, colonoscopy and GYN referrals to be scheduled.  -Return in 1 year or sooner as needed.   Preventive Care 62-12 Years Old, Female Preventive care refers to visits with your health care provider and lifestyle choices that can promote health and wellness. This includes:  A yearly physical exam. This may also be called an annual well check.  Regular dental visits and eye exams.  Immunizations.  Screening for certain conditions.  Healthy lifestyle choices, such as eating a healthy diet, getting regular exercise, not using drugs or products that contain nicotine and tobacco, and limiting alcohol use. What can I expect for my preventive care visit? Physical exam Your health care provider will check your:  Height and weight. This may be used to calculate body mass index (BMI), which tells if you are at a healthy weight.  Heart rate and blood pressure.  Skin for abnormal  spots. Counseling Your health care provider may ask you questions about your:  Alcohol, tobacco, and drug use.  Emotional well-being.  Home and relationship well-being.  Sexual activity.  Eating habits.  Work and work Statistician.  Method of birth control.  Menstrual cycle.  Pregnancy history. What immunizations do I need?  Influenza (flu) vaccine  This is recommended every year. Tetanus, diphtheria, and pertussis (Tdap) vaccine  You may need a Td booster every 10 years. Varicella (chickenpox) vaccine  You may need this if you have not been vaccinated. Zoster (shingles) vaccine  You may need this after age 48. Measles, mumps, and rubella (MMR) vaccine  You may need at least one dose of MMR if you were born in 1957 or later. You may also need a second dose. Pneumococcal conjugate (PCV13) vaccine  You may need this if you have certain conditions and were not previously vaccinated. Pneumococcal polysaccharide (PPSV23) vaccine  You may need one or two doses if you smoke cigarettes or if you have certain conditions. Meningococcal conjugate (MenACWY) vaccine  You may need this if you have certain conditions. Hepatitis A vaccine  You may need this if you have certain conditions or if you travel or work in places where you may be exposed to hepatitis A. Hepatitis B vaccine  You may need this if you have certain conditions or if you travel or work in places where you may be exposed to hepatitis B. Haemophilus influenzae type b (Hib) vaccine  You may need this if you have certain conditions. Human papillomavirus (HPV) vaccine  If recommended by your health care provider, you may need three doses over 6 months. You may receive vaccines as individual doses or as more than one vaccine together in one shot (combination vaccines). Talk with your health care provider about the risks and benefits of combination vaccines. What tests do I need? Blood tests  Lipid and  cholesterol levels. These may be checked every 5 years, or more frequently if you are over 63 years old.  Hepatitis C test.  Hepatitis B test. Screening  Lung cancer screening. You may have this screening every year starting at age 89 if you have a 30-pack-year history of smoking and currently smoke or have quit within the past 15 years.  Colorectal cancer screening. All adults should have this screening starting at age 71 and continuing until age 51. Your health care provider may recommend screening at age 62 if you are at increased risk. You will have tests every 1-10 years, depending on your results and the type of screening test.  Diabetes screening. This is done by checking your blood sugar (glucose) after you have not eaten for a while (fasting). You may have this done every 1-3 years.  Mammogram. This may be done every 1-2 years. Talk with your health care provider about when you should start having regular mammograms. This may depend on whether you have a family history of breast cancer.  BRCA-related cancer screening. This may be done if you have a family history of breast, ovarian, tubal, or peritoneal cancers.  Pelvic exam and Pap test. This may be done every 3 years starting at age 27. Starting at age 82, this may be done every 5 years if you have a Pap test in combination with an HPV test. Other tests  Sexually transmitted disease (STD) testing.  Bone density scan. This is done to screen for osteoporosis. You may have this scan if you are at high risk for osteoporosis. Follow these instructions at home: Eating and drinking  Eat a diet that includes fresh fruits and vegetables, whole grains, lean protein, and low-fat dairy.  Take vitamin and mineral supplements as recommended by your health care provider.  Do not drink alcohol if: ? Your health care provider tells you not to drink. ? You are pregnant, may be pregnant, or are planning to become pregnant.  If you drink  alcohol: ? Limit how much you have to 0-1 drink a day. ? Be aware of how much alcohol is in your drink. In the [place] one drink equals one 12 oz bottle of beer (355 mL), one 5 oz glass of wine (148 mL), or one 1 oz glass of hard liquor (44 mL). Lifestyle  Take daily care of your teeth and gums.  Stay active. Exercise for at least 30 minutes on 5 or more days each week.  Do not use any products that contain nicotine or tobacco, such as cigarettes, e-cigarettes, and chewing tobacco. If you need help quitting, ask your health care provider.  If you are sexually active, practice safe sex. Use a condom or other form of birth control (contraception) in order to prevent pregnancy and STIs (sexually transmitted infections).  If told by your health care provider, take low-dose aspirin daily starting at age 71. What's next?  Visit your health care provider once a year for a well check visit.  Ask your health care provider how often you should have your eyes  and teeth checked.  Stay up to date on all vaccines. This information is not intended to replace advice given to you by your health care provider. Make sure you discuss any questions you have with your health care provider. Document Revised: 02/12/2018 Document Reviewed: 02/12/2018 Elsevier Patient Education  2020 [employer], MD [facility]

## 2019-12-28 NOTE — Addendum Note (Signed)
Addended by: Lerry Liner on: 12/28/2019 01:38 PM   Modules accepted: Orders

## 2019-12-29 LAB — CBC WITH DIFFERENTIAL/PLATELET
Absolute Monocytes: 392 cells/uL (ref 200–950)
Basophils Absolute: 73 cells/uL (ref 0–200)
Basophils Relative: 1.3 %
Eosinophils Absolute: 263 cells/uL (ref 15–500)
Eosinophils Relative: 4.7 %
HCT: 40.7 % (ref 35.0–45.0)
Hemoglobin: 13.9 g/dL (ref 11.7–15.5)
Lymphs Abs: 1518 cells/uL (ref 850–3900)
MCH: 31 pg (ref 27.0–33.0)
MCHC: 34.2 g/dL (ref 32.0–36.0)
MCV: 90.6 fL (ref 80.0–100.0)
MPV: 10.8 fL (ref 7.5–12.5)
Monocytes Relative: 7 %
Neutro Abs: 3354 cells/uL (ref 1500–7800)
Neutrophils Relative %: 59.9 %
Platelets: 274 10*3/uL (ref 140–400)
RBC: 4.49 10*6/uL (ref 3.80–5.10)
RDW: 11.8 % (ref 11.0–15.0)
Total Lymphocyte: 27.1 %
WBC: 5.6 10*3/uL (ref 3.8–10.8)

## 2019-12-29 LAB — LIPID PANEL
Cholesterol: 180 mg/dL (ref <200)
HDL: 73 mg/dL (ref >=50)
LDL Cholesterol (Calc): 93 mg/dL (calc)
Non-HDL Cholesterol (Calc): 107 mg/dL (calc) (ref <130)
Total CHOL/HDL Ratio: 2.5 (calc) (ref <5.0)
Triglycerides: 48 mg/dL (ref <150)

## 2019-12-29 LAB — COMPREHENSIVE METABOLIC PANEL
AG Ratio: 1.9 (calc) (ref 1.0–2.5)
ALT: 10 U/L (ref 6–29)
AST: 17 U/L (ref 10–35)
Albumin: 4.6 g/dL (ref 3.6–5.1)
Alkaline phosphatase (APISO): 48 U/L (ref 37–153)
BUN: 16 mg/dL (ref 7–25)
CO2: 23 mmol/L (ref 20–32)
Calcium: 9.5 mg/dL (ref 8.6–10.4)
Chloride: 105 mmol/L (ref 98–110)
Creat: 0.78 mg/dL (ref 0.50–1.05)
Globulin: 2.4 g/dL (calc) (ref 1.9–3.7)
Glucose, Bld: 74 mg/dL (ref 65–99)
Potassium: 4 mmol/L (ref 3.5–5.3)
Sodium: 139 mmol/L (ref 135–146)
Total Bilirubin: 0.6 mg/dL (ref 0.2–1.2)
Total Protein: 7 g/dL (ref 6.1–8.1)

## 2019-12-29 LAB — HEMOGLOBIN A1C
Hgb A1c MFr Bld: 5.2 % of total Hgb (ref <5.7)
Mean Plasma Glucose: 103 (calc)
eAG (mmol/L): 5.7 (calc)

## 2019-12-29 LAB — VITAMIN B12: Vitamin B-12: 267 pg/mL (ref 200–1100)

## 2019-12-29 LAB — VITAMIN D 25 HYDROXY (VIT D DEFICIENCY, FRACTURES): Vit D, 25-Hydroxy: 34 ng/mL (ref 30–100)

## 2019-12-29 LAB — TSH: TSH: 0.84 mIU/L

## 2020-09-13 ENCOUNTER — Encounter: Payer: 59 | Admitting: Gastroenterology

## 2020-10-23 ENCOUNTER — Ambulatory Visit (AMBULATORY_SURGERY_CENTER): Payer: 59

## 2020-10-23 ENCOUNTER — Other Ambulatory Visit: Payer: Self-pay

## 2020-10-23 VITALS — Ht 65.0 in | Wt 130.0 lb

## 2020-10-23 DIAGNOSIS — Z1211 Encounter for screening for malignant neoplasm of colon: Secondary | ICD-10-CM

## 2020-10-23 MED ORDER — NA SULFATE-K SULFATE-MG SULF 17.5-3.13-1.6 GM/177ML PO SOLN
1.0000 | Freq: Once | ORAL | 0 refills | Status: AC
Start: 2020-10-23 — End: 2020-10-23

## 2020-10-23 NOTE — Progress Notes (Signed)
Pre visit completed via phone call; Patient verified name, DOB, address;   No egg or soy allergy known to patient  No issues with past sedation with any surgeries or procedures Patient denies ever being told they had issues or difficulty with intubation  No FH of Malignant Hyperthermia No diet pills per patient No home 02 use per patient  No blood thinners per patient  Pt denies issues with constipation  No A fib or A flutter  COVID 19 guidelines implemented in PV today with Pt and RN  Pt is fully vaccinated for Covid  NO PA's for preps discussed with pt in PV today  Discussed with pt there will be an out-of-pocket cost for prep and that varies from $0 to 70 dollars  Due to the COVID-19 pandemic we are asking patients to follow certain guidelines.  Pt aware of COVID protocols and [facility] guidelines

## 2020-11-08 ENCOUNTER — Other Ambulatory Visit: Payer: Self-pay

## 2020-11-08 ENCOUNTER — Ambulatory Visit (AMBULATORY_SURGERY_CENTER): Payer: 59 | Admitting: Gastroenterology

## 2020-11-08 ENCOUNTER — Encounter: Payer: Self-pay | Admitting: Gastroenterology

## 2020-11-08 VITALS — BP 100/60 | HR 58 | Temp 97.8°F | Resp 16 | Ht 65.0 in | Wt 130.0 lb

## 2020-11-08 DIAGNOSIS — Z1211 Encounter for screening for malignant neoplasm of colon: Secondary | ICD-10-CM | POA: Diagnosis not present

## 2020-11-08 MED ORDER — SODIUM CHLORIDE 0.9 % IV SOLN: 500.0000 mL | Freq: Once | INTRAVENOUS | Status: DC

## 2020-11-08 NOTE — Patient Instructions (Signed)
Thank you for allowing [place] to care for you today!  Resume previous diet and medications today.  Return to your normal daily activities tomorrow.  Recommend next screening colonoscopy in 10 years.      YOU HAD AN ENDOSCOPIC PROCEDURE TODAY AT THE [facility]:   Refer to the procedure report that was given to you for any specific questions about what was found during the examination.  If the procedure report does not answer your questions, please call your gastroenterologist to clarify.  If you requested that your care partner not be given the details of your procedure findings, then the procedure report has been included in a sealed envelope for you to review at your convenience later.  YOU SHOULD EXPECT: Some feelings of bloating in the abdomen. Passage of more gas than usual.  Walking can help get rid of the air that was put into your GI tract during the procedure and reduce the bloating. If you had a lower endoscopy (such as a colonoscopy or flexible sigmoidoscopy) you may notice spotting of blood in your stool or on the toilet paper. If you underwent a bowel prep for your procedure, you may not have a normal bowel movement for a few days.  Please Note:  You might notice some irritation and congestion in your nose or some drainage.  This is from the oxygen used during your procedure.  There is no need for concern and it should clear up in a day or so.  SYMPTOMS TO REPORT IMMEDIATELY:   Following lower endoscopy (colonoscopy or flexible sigmoidoscopy):  Excessive amounts of blood in the stool  Significant tenderness or worsening of abdominal pains  Swelling of the abdomen that is new, acute  Fever of 100F or higher   For urgent or emergent issues, a gastroenterologist can be reached at any hour by calling [phone]. Do not use MyChart messaging for urgent concerns.    DIET:  We do recommend a small meal at first, but then you may proceed to your regular diet.  Drink  plenty of fluids but you should avoid alcoholic beverages for 24 hours.  ACTIVITY:  You should plan to take it easy for the rest of today and you should NOT DRIVE or use heavy machinery until tomorrow (because of the sedation medicines used during the test).    FOLLOW UP: Our staff will call the number listed on your records 48-72 hours following your procedure to check on you and address any questions or concerns that you may have regarding the information given to you following your procedure. If we do not reach you, we will leave a message.  We will attempt to reach you two times.  During this call, we will ask if you have developed any symptoms of COVID 19. If you develop any symptoms (ie: fever, flu-like symptoms, shortness of breath, cough etc.) before then, please call [phone].  If you test positive for Covid 19 in the 2 weeks post procedure, please call and report this information to [place].    If any biopsies were taken you will be contacted by phone or by letter within the next 1-3 weeks.  Please call us at [phone] if you have not heard about the biopsies in 3 weeks.    SIGNATURES/CONFIDENTIALITY: You and/or your care partner have signed paperwork which will be entered into your electronic medical record.  These signatures attest to the fact that that the information above on your After Visit Summary has been  reviewed and is understood.  Full responsibility of the confidentiality of this discharge information lies with you and/or your care-partner. 

## 2020-11-08 NOTE — Op Note (Signed)
[facility] Patient Name: Cathy Weaver Procedure Date: 11/08/2020 7:58 AM MRN: [id number] Endoscopist: Meryl Dare , MD Age: 50 Referring MD:  Date of Birth: 1970/09/13 Gender: Female Account #: [id number] Procedure:                Colonoscopy Indications:              Screening for colorectal malignant neoplasm Medicines:                Monitored Anesthesia Care Procedure:                Pre-Anesthesia Assessment:                           - Prior to the procedure, a History and Physical                            was performed, and patient medications and                            allergies were reviewed. The patient's tolerance of                            previous anesthesia was also reviewed. The risks                            and benefits of the procedure and the sedation                            options and risks were discussed with the patient.                            All questions were answered, and informed consent                            was obtained. Prior Anticoagulants: The patient has                            taken no previous anticoagulant or antiplatelet                            agents. ASA Grade Assessment: II - A patient with                            mild systemic disease. After reviewing the risks                            and benefits, the patient was deemed in                            satisfactory condition to undergo the procedure.                           After obtaining informed consent, the colonoscope  was passed under direct vision. Throughout the                            procedure, the patient's blood pressure, pulse, and                            oxygen saturations were monitored continuously. The                            Olympus PCF-H190DL (#[id number]) Colonoscope was                            introduced through the anus and advanced to the the                            cecum, identified  by appendiceal orifice and                            ileocecal valve. The ileocecal valve, appendiceal                            orifice, and rectum were photographed. The quality                            of the bowel preparation was good. The colonoscopy                            was performed without difficulty. The patient                            tolerated the procedure well. Scope In: 8:09:14 AM Scope Out: 8:27:22 AM Scope Withdrawal Time: 0 hours 12 minutes 4 seconds  Total Procedure Duration: 0 hours 18 minutes 8 seconds  Findings:                 The perianal and digital rectal examinations were                            normal.                           The entire examined colon appeared normal on direct                            and retroflexion views. Complications:            No immediate complications. Estimated blood loss:                            None. Estimated Blood Loss:     Estimated blood loss: none. Impression:               - The entire examined colon is normal on direct and                            retroflexion views.                           -  No specimens collected. Recommendation:           - Repeat colonoscopy in 10 years for screening                            purposes.                           - Patient has a contact number available for                            emergencies. The signs and symptoms of potential                            delayed complications were discussed with the                            patient. Return to normal activities tomorrow.                            Written discharge instructions were provided to the                            patient.                           - Resume previous diet.                           - Continue present medications. Meryl Dare, MD 11/08/2020 8:32:38 AM This report has been signed electronically.

## 2020-11-08 NOTE — Progress Notes (Signed)
Pt's states no medical or surgical changes since previsit or office visit. 

## 2020-11-08 NOTE — Progress Notes (Signed)
To PACU, VSS. Report to [email or web address] 

## 2020-11-10 ENCOUNTER — Telehealth: Payer: Self-pay | Admitting: *Deleted

## 2020-11-10 NOTE — Telephone Encounter (Signed)
  Follow up Call-  Call back number 11/08/2020  Post procedure Call Back phone  # [phone]  Permission to leave phone message Yes  Some recent data might be hidden     Patient questions:  Do you have a fever, pain , or abdominal swelling? No. Pain Score  0 *  Have you tolerated food without any problems? Yes.    Have you been able to return to your normal activities? Yes.    Do you have any questions about your discharge instructions: Diet   No. Medications  No. Follow up visit  No.  Do you have questions or concerns about your Care? No.  Actions: * If pain score is 4 or above: No action needed, pain <4

## 2021-05-17 ENCOUNTER — Telehealth: Payer: Self-pay | Admitting: Internal Medicine

## 2021-05-17 NOTE — Telephone Encounter (Signed)
Left voicemail requesting for patient to call back to schedule an appointment to see Dr.Hernandez because she hasn't been in the office since 2021.

## 2021-08-09 DIAGNOSIS — R0981 Nasal congestion: Secondary | ICD-10-CM | POA: Diagnosis not present

## 2021-08-09 DIAGNOSIS — J301 Allergic rhinitis due to pollen: Secondary | ICD-10-CM | POA: Diagnosis not present

## 2022-02-04 DIAGNOSIS — Z01419 Encounter for gynecological examination (general) (routine) without abnormal findings: Secondary | ICD-10-CM | POA: Diagnosis not present

## 2022-02-04 DIAGNOSIS — Z1231 Encounter for screening mammogram for malignant neoplasm of breast: Secondary | ICD-10-CM | POA: Diagnosis not present

## 2022-02-04 DIAGNOSIS — Z6822 Body mass index (BMI) 22.0-22.9, adult: Secondary | ICD-10-CM | POA: Diagnosis not present

## 2022-02-25 DIAGNOSIS — Z131 Encounter for screening for diabetes mellitus: Secondary | ICD-10-CM | POA: Diagnosis not present

## 2022-02-25 DIAGNOSIS — Z0000 Encounter for general adult medical examination without abnormal findings: Secondary | ICD-10-CM | POA: Diagnosis not present

## 2022-02-25 DIAGNOSIS — Z30431 Encounter for routine checking of intrauterine contraceptive device: Secondary | ICD-10-CM | POA: Diagnosis not present

## 2022-02-25 DIAGNOSIS — Z13220 Encounter for screening for lipoid disorders: Secondary | ICD-10-CM | POA: Diagnosis not present

## 2022-03-04 DIAGNOSIS — D259 Leiomyoma of uterus, unspecified: Secondary | ICD-10-CM | POA: Diagnosis not present

## 2022-03-04 DIAGNOSIS — Z30432 Encounter for removal of intrauterine contraceptive device: Secondary | ICD-10-CM | POA: Diagnosis not present

## 2023-07-17 DIAGNOSIS — Z124 Encounter for screening for malignant neoplasm of cervix: Secondary | ICD-10-CM | POA: Diagnosis not present

## 2023-07-17 DIAGNOSIS — Z01411 Encounter for gynecological examination (general) (routine) with abnormal findings: Secondary | ICD-10-CM | POA: Diagnosis not present

## 2023-07-17 DIAGNOSIS — Z1231 Encounter for screening mammogram for malignant neoplasm of breast: Secondary | ICD-10-CM | POA: Diagnosis not present

## 2023-07-17 DIAGNOSIS — Z01419 Encounter for gynecological examination (general) (routine) without abnormal findings: Secondary | ICD-10-CM | POA: Diagnosis not present

## 2023-07-17 DIAGNOSIS — Z113 Encounter for screening for infections with a predominantly sexual mode of transmission: Secondary | ICD-10-CM | POA: Diagnosis not present

## 2023-07-17 DIAGNOSIS — Z1331 Encounter for screening for depression: Secondary | ICD-10-CM | POA: Diagnosis not present

## 2024-02-23 DIAGNOSIS — Z13220 Encounter for screening for lipoid disorders: Secondary | ICD-10-CM | POA: Diagnosis not present

## 2024-02-23 DIAGNOSIS — Z0000 Encounter for general adult medical examination without abnormal findings: Secondary | ICD-10-CM | POA: Diagnosis not present

## 2024-02-23 DIAGNOSIS — Z131 Encounter for screening for diabetes mellitus: Secondary | ICD-10-CM | POA: Diagnosis not present

## 2024-02-23 DIAGNOSIS — Z1329 Encounter for screening for other suspected endocrine disorder: Secondary | ICD-10-CM | POA: Diagnosis not present
